# Patient Record
Sex: Female | Born: 1990
Health system: Southern US, Community
[De-identification: ages and names within clinical notes are randomized; demographics above are authoritative.]

## PROBLEM LIST (undated history)

## (undated) DIAGNOSIS — D259 Leiomyoma of uterus, unspecified: Secondary | ICD-10-CM

## (undated) DIAGNOSIS — O24419 Gestational diabetes mellitus in pregnancy, unspecified control: Secondary | ICD-10-CM

## (undated) HISTORY — DX: Leiomyoma of uterus, unspecified: D25.9

## (undated) HISTORY — PX: NO PAST SURGERIES: SHX2092

---

## 2013-01-08 DIAGNOSIS — F411 Generalized anxiety disorder: Secondary | ICD-10-CM | POA: Insufficient documentation

## 2013-02-21 DIAGNOSIS — K219 Gastro-esophageal reflux disease without esophagitis: Secondary | ICD-10-CM | POA: Insufficient documentation

## 2015-10-08 DIAGNOSIS — K649 Unspecified hemorrhoids: Secondary | ICD-10-CM | POA: Insufficient documentation

## 2019-09-20 ENCOUNTER — Encounter: Payer: Self-pay | Admitting: Advanced Practice Midwife

## 2019-09-29 ENCOUNTER — Other Ambulatory Visit: Payer: Self-pay

## 2019-09-29 ENCOUNTER — Ambulatory Visit (INDEPENDENT_AMBULATORY_CARE_PROVIDER_SITE_OTHER): Payer: 59 | Admitting: Family Medicine

## 2019-09-29 ENCOUNTER — Encounter: Payer: Self-pay | Admitting: Family Medicine

## 2019-09-29 DIAGNOSIS — Z3400 Encounter for supervision of normal first pregnancy, unspecified trimester: Secondary | ICD-10-CM

## 2019-09-29 DIAGNOSIS — Z3401 Encounter for supervision of normal first pregnancy, first trimester: Secondary | ICD-10-CM

## 2019-09-29 DIAGNOSIS — Z3A09 9 weeks gestation of pregnancy: Secondary | ICD-10-CM

## 2019-09-29 DIAGNOSIS — Z113 Encounter for screening for infections with a predominantly sexual mode of transmission: Secondary | ICD-10-CM | POA: Diagnosis not present

## 2019-09-29 DIAGNOSIS — O0990 Supervision of high risk pregnancy, unspecified, unspecified trimester: Secondary | ICD-10-CM | POA: Insufficient documentation

## 2019-09-29 NOTE — Progress Notes (Signed)
  Subjective:  Crystal Joseph is a G1P0 [redacted]w[redacted]d by LMP being seen today for her first obstetrical visit.  Her obstetrical history is significant for first pregnancy. FOB is her partner. Patient does intend to breast feed. Pregnancy history fully reviewed.  She reports a normal PAP for the past two years, done with [facility].  Patient reports no complaints.  BP 106/60   Pulse 80   Ht 4\' 11"  (1.499 m)   Wt 118 lb (53.5 kg)   LMP 07/26/2019 (Exact Date)   BMI 23.83 kg/m   HISTORY: OB History  Gravida Para Term Preterm AB Living  1            SAB TAB Ectopic Multiple Live Births               # Outcome Date GA Lbr Len/2nd Weight Sex Delivery Anes PTL Lv  1 Current             History reviewed. No pertinent past medical history.  History reviewed. No pertinent surgical history.  History reviewed. No pertinent family history.   Exam  BP 106/60   Pulse 80   Ht 4\' 11"  (1.499 m)   Wt 118 lb (53.5 kg)   LMP 07/26/2019 (Exact Date)   BMI 23.83 kg/m   Chaperone present during exam  CONSTITUTIONAL: Well-developed, well-nourished female in no acute distress.  HENT:  Normocephalic, atraumatic, External right and left ear normal. Oropharynx is clear and moist EYES: Conjunctivae and EOM are normal. Pupils are equal, round, and reactive to light. No scleral icterus.  NECK: Normal range of motion, supple, no masses.  Normal thyroid.  CARDIOVASCULAR: Normal heart rate noted, regular rhythm RESPIRATORY: Clear to auscultation bilaterally. Effort and breath sounds normal, no problems with respiration noted. BREASTS: Symmetric in size. No masses, skin changes, nipple drainage, or lymphadenopathy. ABDOMEN: Soft, normal bowel sounds, no distention noted.  No tenderness, rebound or guarding.  PELVIC: not done MUSCULOSKELETAL: Normal range of motion. No tenderness.  No cyanosis, clubbing, or edema.  2+ distal pulses. SKIN: Skin is warm and dry. No rash noted. Not diaphoretic. No erythema. No  pallor. NEUROLOGIC: Alert and oriented to person, place, and time. Normal reflexes, muscle tone coordination. No cranial nerve deficit noted. PSYCHIATRIC: Normal mood and affect. Normal behavior. Normal judgment and thought content.    Assessment:    Pregnancy: G1P0 Patient Active Problem List   Diagnosis Date Noted  . Supervision of normal first pregnancy, antepartum 09/29/2019      Plan:   1. Supervision of normal first pregnancy, antepartum FHT and FH normal.  Dating confirmed by [place] today. Discussed diet during prengancy She does want genetic testing - NIPS, CF, SMA ordered Discussed practice with use of midwives, fellows.  - Hemoglobinopathy Evaluation - Obstetric Panel, Including HIV - Culture, OB Urine - GC/Chlamydia probe amp ()not at [facility] - [facility] AMB BABYSCRIPTS SCHEDULE OPTIMIZATION - Hepatitis C Antibody - SMN1 COPY NUMBER ANALYSIS (SMA Carrier Screen) - Cystic fibrosis gene test     Problem list reviewed and updated. 75% of 30 min visit spent on counseling and coordination of care.     Truett Mainland 09/29/2019

## 2019-09-29 NOTE — Progress Notes (Signed)
DATING AND VIABILITY SONOGRAM   Crystal Joseph is a 29 y.o. year old G1P0 with LMP Patient's last menstrual period was 07/26/2019 (exact date). which would correlate to  [redacted]w[redacted]d weeks gestation.  She has regular menstrual cycles.   She is here today for a confirmatory initial sonogram.    GESTATION: SINGLETON     FETAL ACTIVITY:          Heart rate        175 bpm          The fetus is active.       GESTATIONAL AGE AND  BIOMETRICS:  Gestational criteria: Estimated Date of Delivery: 05/01/20 by LMP now at [redacted]w[redacted]d  Previous Scans:0  GESTATIONAL SAC           4.09cm         9-3weeks  CROWN RUMP LENGTH           2.85 cm         9-4 weeks                                                                               AVERAGE EGA(BY THIS SCAN):  9-4 weeks  WORKING EDD( LMP ):  05-01-2020     TECHNICIAN COMMENTS: Patient informed that the ultrasound is considered a limited obstetric ultrasound and is not intended to be a complete ultrasound exam. Patient also informed that the ultrasound is not being completed with the intent of assessing for fetal or placental anomalies or any pelvic abnormalities. Explained that the purpose of today's ultrasound is to assess for fetal heart rate. Patient acknowledges the purpose of the exam and the limitations of the study.     Kathrene Alu 09/29/2019 9:53 AM

## 2019-09-29 NOTE — Patient Instructions (Signed)
First Trimester of Pregnancy The first trimester of pregnancy is from week 1 until the end of week 13 (months 1 through 3). A week after a sperm fertilizes an egg, the egg will implant on the wall of the uterus. This embryo will begin to develop into a baby. Genes from you and your partner will form the baby. The female genes will determine whether the baby will be a boy or a girl. At 6-8 weeks, the eyes and face will be formed, and the heartbeat can be seen on ultrasound. At the end of 12 weeks, all the baby's organs will be formed. Now that you are pregnant, you will want to do everything you can to have a healthy baby. Two of the most important things are to get good prenatal care and to follow your health care provider's instructions. Prenatal care is all the medical care you receive before the baby's birth. This care will help prevent, find, and treat any problems during the pregnancy and childbirth. Body changes during your first trimester Your body goes through many changes during pregnancy. The changes vary from woman to woman.  You may gain or lose a couple of pounds at first.  You may feel sick to your stomach (nauseous) and you may throw up (vomit). If the vomiting is uncontrollable, call your health care provider.  You may tire easily.  You may develop headaches that can be relieved by medicines. All medicines should be approved by your health care provider.  You may urinate more often. Painful urination may mean you have a bladder infection.  You may develop heartburn as a result of your pregnancy.  You may develop constipation because certain hormones are causing the muscles that push stool through your intestines to slow down.  You may develop hemorrhoids or swollen veins (varicose veins).  Your breasts may begin to grow larger and become tender. Your nipples may stick out more, and the tissue that surrounds them (areola) may become darker.  Your gums may bleed and may be  sensitive to brushing and flossing.  Dark spots or blotches (chloasma, mask of pregnancy) may develop on your face. This will likely fade after the baby is born.  Your menstrual periods will stop.  You may have a loss of appetite.  You may develop cravings for certain kinds of food.  You may have changes in your emotions from day to day, such as being excited to be pregnant or being concerned that something may go wrong with the pregnancy and baby.  You may have more vivid and strange dreams.  You may have changes in your hair. These can include thickening of your hair, rapid growth, and changes in texture. Some women also have hair loss during or after pregnancy, or hair that feels dry or thin. Your hair will most likely return to normal after your baby is born. What to expect at prenatal visits During a routine prenatal visit:  You will be weighed to make sure you and the baby are growing normally.  Your blood pressure will be taken.  Your abdomen will be measured to track your baby's growth.  The fetal heartbeat will be listened to between weeks 10 and 14 of your pregnancy.  Test results from any previous visits will be discussed. Your health care provider may ask you:  How you are feeling.  If you are feeling the baby move.  If you have had any abnormal symptoms, such as leaking fluid, bleeding, severe headaches, or abdominal   cramping.  If you are using any tobacco products, including cigarettes, chewing tobacco, and electronic cigarettes.  If you have any questions. Other tests that may be performed during your first trimester include:  Blood tests to find your blood type and to check for the presence of any previous infections. The tests will also be used to check for low iron levels (anemia) and protein on red blood cells (Rh antibodies). Depending on your risk factors, or if you previously had diabetes during pregnancy, you may have tests to check for high blood sugar  that affects pregnant women (gestational diabetes).  Urine tests to check for infections, diabetes, or protein in the urine.  An ultrasound to confirm the proper growth and development of the baby.  Fetal screens for spinal cord problems (spina bifida) and Down syndrome.  HIV (human immunodeficiency virus) testing. Routine prenatal testing includes screening for HIV, unless you choose not to have this test.  You may need other tests to make sure you and the baby are doing well. Follow these instructions at home: Medicines  Follow your health care provider's instructions regarding medicine use. Specific medicines may be either safe or unsafe to take during pregnancy.  Take a prenatal vitamin that contains at least 600 micrograms (mcg) of folic acid.  If you develop constipation, try taking a stool softener if your health care provider approves. Eating and drinking   Eat a balanced diet that includes fresh fruits and vegetables, whole grains, good sources of protein such as meat, eggs, or tofu, and low-fat dairy. Your health care provider will help you determine the amount of weight gain that is right for you.  Avoid raw meat and uncooked cheese. These carry germs that can cause birth defects in the baby.  Eating four or five small meals rather than three large meals a day may help relieve nausea and vomiting. If you start to feel nauseous, eating a few soda crackers can be helpful. Drinking liquids between meals, instead of during meals, also seems to help ease nausea and vomiting.  Limit foods that are high in fat and processed sugars, such as fried and sweet foods.  To prevent constipation: ? Eat foods that are high in fiber, such as fresh fruits and vegetables, whole grains, and beans. ? Drink enough fluid to keep your urine clear or pale yellow. Activity  Exercise only as directed by your health care provider. Most women can continue their usual exercise routine during  pregnancy. Try to exercise for 30 minutes at least 5 days a week. Exercising will help you: ? Control your weight. ? Stay in shape. ? Be prepared for labor and delivery.  Experiencing pain or cramping in the lower abdomen or lower back is a good sign that you should stop exercising. Check with your health care provider before continuing with normal exercises.  Try to avoid standing for long periods of time. Move your legs often if you must stand in one place for a long time.  Avoid heavy lifting.  Wear low-heeled shoes and practice good posture.  You may continue to have sex unless your health care provider tells you not to. Relieving pain and discomfort  Wear a good support bra to relieve breast tenderness.  Take warm sitz baths to soothe any pain or discomfort caused by hemorrhoids. Use hemorrhoid cream if your health care provider approves.  Rest with your legs elevated if you have leg cramps or low back pain.  If you develop varicose veins in   your legs, wear support hose. Elevate your feet for 15 minutes, 3-4 times a day. Limit salt in your diet. Prenatal care  Schedule your prenatal visits by the twelfth week of pregnancy. They are usually scheduled monthly at first, then more often in the last 2 months before delivery.  Write down your questions. Take them to your prenatal visits.  Keep all your prenatal visits as told by your health care provider. This is important. Safety  Wear your seat belt at all times when driving.  Make a list of emergency phone numbers, including numbers for family, friends, the hospital, and police and fire departments. General instructions  Ask your health care provider for a referral to a local prenatal education class. Begin classes no later than the beginning of month 6 of your pregnancy.  Ask for help if you have counseling or nutritional needs during pregnancy. Your health care provider can offer advice or refer you to specialists for help  with various needs.  Do not use hot tubs, steam rooms, or saunas.  Do not douche or use tampons or scented sanitary pads.  Do not cross your legs for long periods of time.  Avoid cat litter boxes and soil used by cats. These carry germs that can cause birth defects in the baby and possibly loss of the fetus by miscarriage or stillbirth.  Avoid all smoking, herbs, alcohol, and medicines not prescribed by your health care provider. Chemicals in these products affect the formation and growth of the baby.  Do not use any products that contain nicotine or tobacco, such as cigarettes and e-cigarettes. If you need help quitting, ask your health care provider. You may receive counseling support and other resources to help you quit.  Schedule a dentist appointment. At home, brush your teeth with a soft toothbrush and be gentle when you floss. Contact a health care provider if:  You have dizziness.  You have mild pelvic cramps, pelvic pressure, or nagging pain in the abdominal area.  You have persistent nausea, vomiting, or diarrhea.  You have a bad smelling vaginal discharge.  You have pain when you urinate.  You notice increased swelling in your face, hands, legs, or ankles.  You are exposed to fifth disease or chickenpox.  You are exposed to German measles (rubella) and have never had it. Get help right away if:  You have a fever.  You are leaking fluid from your vagina.  You have spotting or bleeding from your vagina.  You have severe abdominal cramping or pain.  You have rapid weight gain or loss.  You vomit blood or material that looks like coffee grounds.  You develop a severe headache.  You have shortness of breath.  You have any kind of trauma, such as from a fall or a car accident. Summary  The first trimester of pregnancy is from week 1 until the end of week 13 (months 1 through 3).  Your body goes through many changes during pregnancy. The changes vary from  woman to woman.  You will have routine prenatal visits. During those visits, your health care provider will examine you, discuss any test results you may have, and talk with you about how you are feeling. This information is not intended to replace advice given to you by your health care provider. Make sure you discuss any questions you have with your health care provider. Document Revised: 07/03/2017 Document Reviewed: 07/02/2016 Elsevier Patient Education  2020 [employer] Inc[employer]  

## 2019-09-30 LAB — GC/CHLAMYDIA PROBE AMP (~~LOC~~) NOT AT ARMC
Chlamydia: NEGATIVE
Comment: NEGATIVE
Comment: NORMAL
Neisseria Gonorrhea: NEGATIVE

## 2019-10-01 LAB — CULTURE, OB URINE

## 2019-10-01 LAB — URINE CULTURE, OB REFLEX

## 2019-10-12 ENCOUNTER — Other Ambulatory Visit: Payer: Self-pay

## 2019-10-12 DIAGNOSIS — Z3400 Encounter for supervision of normal first pregnancy, unspecified trimester: Secondary | ICD-10-CM

## 2019-10-12 MED ORDER — PRENATAL PLUS 27-1 MG PO TABS: 1.0000 | ORAL_TABLET | Freq: Every day | ORAL | 11 refills | Status: DC

## 2019-10-12 MED FILL — PRENATAL VITAMIN PLUS LOW I: 27-1 | 30 days supply | Qty: 30 | Fill #0

## 2019-10-12 NOTE — Progress Notes (Signed)
Pt sent Mychart message requesting a Rx for prenatal vitamins. Rx was sent to pt's  was sent to pt's pharmacy. chiquita l wilson, CMA

## 2019-10-13 LAB — SMN1 COPY NUMBER ANALYSIS (SMA CARRIER SCREENING)

## 2019-10-13 LAB — HEMOGLOBINOPATHY EVALUATION
Ferritin: 86 ng/mL (ref 15–150)
Hgb A2 Quant: 2.4 % (ref 1.8–3.2)
Hgb A: 96.9 % (ref 96.4–98.8)
Hgb C: 0 %
Hgb F Quant: 0.7 % (ref 0.0–2.0)
Hgb S: 0 %
Hgb Solubility: NEGATIVE
Hgb Variant: 0 %

## 2019-10-13 LAB — OBSTETRIC PANEL, INCLUDING HIV
Antibody Screen: NEGATIVE
Basophils Absolute: 0 10*3/uL (ref 0.0–0.2)
Basos: 0 %
EOS (ABSOLUTE): 0.3 10*3/uL (ref 0.0–0.4)
Eos: 3 %
HIV Screen 4th Generation wRfx: NONREACTIVE
Hematocrit: 39.1 % (ref 34.0–46.6)
Hemoglobin: 12.9 g/dL (ref 11.1–15.9)
Hepatitis B Surface Ag: NEGATIVE
Immature Grans (Abs): 0 10*3/uL (ref 0.0–0.1)
Immature Granulocytes: 0 %
Lymphocytes Absolute: 1.6 10*3/uL (ref 0.7–3.1)
Lymphs: 20 %
MCH: 30.1 pg (ref 26.6–33.0)
MCHC: 33 g/dL (ref 31.5–35.7)
MCV: 91 fL (ref 79–97)
Monocytes Absolute: 0.5 10*3/uL (ref 0.1–0.9)
Monocytes: 6 %
Neutrophils Absolute: 5.5 10*3/uL (ref 1.4–7.0)
Neutrophils: 71 %
Platelets: 268 10*3/uL (ref 150–450)
RBC: 4.29 x10E6/uL (ref 3.77–5.28)
RDW: 13.4 % (ref 11.7–15.4)
RPR Ser Ql: NONREACTIVE
Rh Factor: POSITIVE
Rubella Antibodies, IGG: 4.72 index (ref >0.99)
WBC: 7.9 10*3/uL (ref 3.4–10.8)

## 2019-10-13 LAB — HEPATITIS C ANTIBODY: Hep C Virus Ab: <0.1 s/co ratio (ref 0.0–0.9)

## 2019-10-13 LAB — CYSTIC FIBROSIS GENE TEST

## 2019-10-25 ENCOUNTER — Encounter: Payer: 59 | Admitting: Advanced Practice Midwife

## 2019-10-27 ENCOUNTER — Other Ambulatory Visit: Payer: Self-pay

## 2019-10-27 ENCOUNTER — Ambulatory Visit (INDEPENDENT_AMBULATORY_CARE_PROVIDER_SITE_OTHER): Payer: 59 | Admitting: Family Medicine

## 2019-10-27 VITALS — BP 110/64 | HR 87 | Wt 118.0 lb

## 2019-10-27 DIAGNOSIS — Z3400 Encounter for supervision of normal first pregnancy, unspecified trimester: Secondary | ICD-10-CM

## 2019-10-27 DIAGNOSIS — Z3A13 13 weeks gestation of pregnancy: Secondary | ICD-10-CM

## 2019-10-27 NOTE — Progress Notes (Addendum)
   PRENATAL VISIT NOTE  Subjective:  Crystal Joseph is a 29 y.o. G1P0 at [redacted]w[redacted]d by LMP being seen today for ongoing prenatal care.  She is currently monitored for the following issues for this low-risk pregnancy and has Supervision of normal first pregnancy, antepartum on their problem list.  Patient reports occasional abdominal pain and occasional nausea but no vomiting. Patient reports no bleeding, no contractions, no cramping and no leaking.  Contractions: Not present. Vag. Bleeding: Scant.  Movement: Absent. Denies leaking of fluid.   The following portions of the patient's history were reviewed and updated as appropriate: allergies, current medications, past family history, past medical history, past social history, past surgical history and problem list.   Objective:   Vitals:   10/27/19 0916  BP: 110/64  Pulse: 87  Weight: 118 lb (53.5 kg)    Fetal Status: Fetal Heart Rate (bpm): 160   Movement: Absent     General:  Alert, oriented and cooperative. Patient is in no acute distress.  Skin: Skin is warm and dry. No rash noted.   Cardiovascular: Normal heart rate noted  Respiratory: Normal respiratory effort, no problems with respiration noted  Abdomen: Soft, gravid, appropriate for gestational age.  Pain/Pressure: Present     Pelvic: Cervical exam deferred        Extremities: Normal range of motion.  Edema: None  Mental Status: Normal mood and affect. Normal behavior. Normal judgment and thought content.   Assessment and Plan:  Pregnancy: G1P0 at [redacted]w[redacted]d  1. Supervision of normal first pregnancy, antepartum FH normal. Patient desires NIPS (Panorama), but has to check with insurance to see if it will be covered. If not covered, consider working with company to lower cost or quad screen. F/U in 1 month   Preterm labor symptoms and general obstetric precautions including but not limited to vaginal bleeding, contractions, leaking of fluid and fetal movement were reviewed in detail  with the patient. Please refer to After Visit Summary for other counseling recommendations.   No follow-ups on file.   No future appointments.  Crystal Joseph, Medical Student   I saw the patient in conjunction with Rodrigo Ran, MS3.  No concerns. Patient doing well. Fundal height appropriate for GA. FHT normal. She is checking with insurance about coverage of Panorama. Also discussed Quad screen if it isn't covered. All questions answered.  Truett Mainland, DO 10/27/2019 11:16 AM

## 2019-10-27 NOTE — Progress Notes (Signed)
   PRENATAL VISIT NOTE  Subjective:  Crystal Joseph is a 29 y.o. G1P0 at [redacted]w[redacted]d being seen today for ongoing prenatal care.  She is currently monitored for the following issues for this low-risk pregnancy and has Supervision of normal first pregnancy, antepartum on their problem list.  Patient reports no complaints.  Contractions: Not present. Vag. Bleeding: Scant.  Movement: Absent. Denies leaking of fluid.   The following portions of the patient's history were reviewed and updated as appropriate: allergies, current medications, past family history, past medical history, past social history, past surgical history and problem list.   Objective:   Vitals:   10/27/19 0916  BP: 110/64  Pulse: 87  Weight: 118 lb (53.5 kg)    Fetal Status: Fetal Heart Rate (bpm): 160   Movement: Absent     General:  Alert, oriented and cooperative. Patient is in no acute distress.  Skin: Skin is warm and dry. No rash noted.   Cardiovascular: Normal heart rate noted  Respiratory: Normal respiratory effort, no problems with respiration noted  Abdomen: Soft, gravid, appropriate for gestational age.  Pain/Pressure: Present     Pelvic: Cervical exam deferred        Extremities: Normal range of motion.  Edema: None  Mental Status: Normal mood and affect. Normal behavior. Normal judgment and thought content.   Assessment and Plan:  Pregnancy: G1P0 at [redacted]w[redacted]d 1. Supervision of normal first pregnancy, antepartum FHT and FH normal.   Preterm labor symptoms and general obstetric precautions including but not limited to vaginal bleeding, contractions, leaking of fluid and fetal movement were reviewed in detail with the patient. Please refer to After Visit Summary for other counseling recommendations.   No follow-ups on file.  Future Appointments  Date Time Provider [place]  11/29/2019  8:30 AM Seabron Spates, CNM [facility] None    Truett Mainland, DO

## 2019-11-14 MED FILL — PRENATAL VITAMIN PLUS LOW I: 27-1 | 30 days supply | Qty: 30 | Fill #1

## 2019-11-29 ENCOUNTER — Encounter: Payer: Self-pay | Admitting: Advanced Practice Midwife

## 2019-11-29 ENCOUNTER — Other Ambulatory Visit: Payer: Self-pay

## 2019-11-29 ENCOUNTER — Ambulatory Visit (INDEPENDENT_AMBULATORY_CARE_PROVIDER_SITE_OTHER): Payer: 59 | Admitting: Advanced Practice Midwife

## 2019-11-29 VITALS — BP 98/63 | HR 80 | Wt 124.0 lb

## 2019-11-29 DIAGNOSIS — Z3400 Encounter for supervision of normal first pregnancy, unspecified trimester: Secondary | ICD-10-CM

## 2019-11-29 NOTE — Patient Instructions (Signed)

## 2019-11-29 NOTE — Progress Notes (Signed)
   PRENATAL VISIT NOTE  Subjective:  Crystal Joseph is a 29 y.o. G1P0 at [redacted]w[redacted]d being seen today for ongoing prenatal care.  She is currently monitored for the following issues for this low-risk pregnancy and has Supervision of normal first pregnancy, antepartum; Anxiety state; Esophageal reflux; and Hemorrhoids on their problem list.  Patient reports no complaints.  Contractions: Not present. Vag. Bleeding: None.  Movement: Absent. Denies leaking of fluid.   The following portions of the patient's history were reviewed and updated as appropriate: allergies, current medications, past family history, past medical history, past social history, past surgical history and problem list.   Objective:   Vitals:   11/29/19 0834  BP: 98/63  Pulse: 80  Weight: 124 lb (56.2 kg)    Fetal Status: Fetal Heart Rate (bpm): 151   Movement: Absent     General:  Alert, oriented and cooperative. Patient is in no acute distress.  Skin: Skin is warm and dry. No rash noted.   Cardiovascular: Normal heart rate noted  Respiratory: Normal respiratory effort, no problems with respiration noted  Abdomen: Soft, gravid, appropriate for gestational age.  Pain/Pressure: Present     Pelvic: Cervical exam deferred        Extremities: Normal range of motion.  Edema: None  Mental Status: Normal mood and affect. Normal behavior. Normal judgment and thought content.   Assessment and Plan:  Pregnancy: G1P0 at [redacted]w[redacted]d 1. Supervision of normal first pregnancy, antepartum       Not feeling movement yet       Plan [place] for anatomy       Declines panorama but wants to do AFP today - [place] MFM OB COMP + 14 WK; Future - AFP, Serum, Open Spina Bifida  Preterm labor symptoms and general obstetric precautions including but not limited to vaginal bleeding, contractions, leaking of fluid and fetal movement were reviewed in detail with the patient. Please refer to After Visit Summary for other counseling recommendations.   Return in about  6 weeks (around 01/10/2020) for [facility].  Future Appointments  Date Time Provider [place]  12/16/2019  2:45 PM [place] 4 [facility]  01/10/2020  8:30 AM Seabron Spates, CNM [facility] None    Hansel Feinstein, CNM

## 2019-12-01 LAB — AFP, SERUM, OPEN SPINA BIFIDA
AFP MoM: 1.1
AFP Value: 56 ng/mL
Gest. Age on Collection Date: 18 weeks
Maternal Age At EDD: 31.6 yr
OSBR Risk 1 IN: 8933
Test Results:: NEGATIVE
Weight: 124 [lb_av]

## 2019-12-16 ENCOUNTER — Other Ambulatory Visit: Payer: Self-pay

## 2019-12-16 ENCOUNTER — Ambulatory Visit (HOSPITAL_COMMUNITY): Payer: 59 | Attending: Obstetrics and Gynecology

## 2019-12-16 DIAGNOSIS — Z363 Encounter for antenatal screening for malformations: Secondary | ICD-10-CM

## 2019-12-16 DIAGNOSIS — Z3400 Encounter for supervision of normal first pregnancy, unspecified trimester: Secondary | ICD-10-CM | POA: Insufficient documentation

## 2019-12-16 DIAGNOSIS — D259 Leiomyoma of uterus, unspecified: Secondary | ICD-10-CM

## 2019-12-16 DIAGNOSIS — Z3A20 20 weeks gestation of pregnancy: Secondary | ICD-10-CM | POA: Diagnosis not present

## 2019-12-16 DIAGNOSIS — O3412 Maternal care for benign tumor of corpus uteri, second trimester: Secondary | ICD-10-CM | POA: Diagnosis not present

## 2019-12-20 ENCOUNTER — Other Ambulatory Visit: Payer: Self-pay | Admitting: *Deleted

## 2019-12-20 DIAGNOSIS — D259 Leiomyoma of uterus, unspecified: Secondary | ICD-10-CM

## 2020-01-10 ENCOUNTER — Encounter: Payer: Self-pay | Admitting: Advanced Practice Midwife

## 2020-01-10 ENCOUNTER — Ambulatory Visit (INDEPENDENT_AMBULATORY_CARE_PROVIDER_SITE_OTHER): Payer: 59 | Admitting: Advanced Practice Midwife

## 2020-01-10 ENCOUNTER — Other Ambulatory Visit: Payer: Self-pay

## 2020-01-10 DIAGNOSIS — Z3402 Encounter for supervision of normal first pregnancy, second trimester: Secondary | ICD-10-CM

## 2020-01-10 DIAGNOSIS — Z3400 Encounter for supervision of normal first pregnancy, unspecified trimester: Secondary | ICD-10-CM

## 2020-01-10 DIAGNOSIS — Z3A24 24 weeks gestation of pregnancy: Secondary | ICD-10-CM

## 2020-01-10 NOTE — Progress Notes (Signed)
PRENATAL VISIT NOTE  Subjective:  Crystal Joseph is a 29 y.o. G1P0 at [redacted]w[redacted]d being seen today for ongoing prenatal care.  She is currently monitored for the following issues for this low-risk pregnancy and has Supervision of normal first pregnancy, antepartum; Anxiety state; Esophageal reflux; and Hemorrhoids on their problem list.  Patient reports occasional round ligament pain.  Contractions: Not present. Vag. Bleeding: None.  Movement: Present. Denies leaking of fluid.   The following portions of the patient's history were reviewed and updated as appropriate: allergies, current medications, past family history, past medical history, past social history, past surgical history and problem list.   Objective:   Vitals:   01/10/20 0838  BP: 94/64  Pulse: 82  Weight: 130 lb (59 kg)    Fetal Status: Fetal Heart Rate (bpm): 162   Movement: Present     General:  Alert, oriented and cooperative. Patient is in no acute distress.  Skin: Skin is warm and dry. No rash noted.   Cardiovascular: Normal heart rate noted  Respiratory: Normal respiratory effort, no problems with respiration noted  Abdomen: Soft, gravid, appropriate for gestational age.  Pain/Pressure: Present     Pelvic: Cervical exam deferred        Extremities: Normal range of motion.  Edema: None  Mental Status: Normal mood and affect. Normal behavior. Normal judgment and thought content.   Assessment and Plan:  Pregnancy: G1P0 at [redacted]w[redacted]d 1. Supervision of normal first pregnancy, antepartum     Discussed round ligament pain     May want waterbirth    Why consider waterbirth? . Gentle birth for babies . Less pain medicine used in labor . May allow for passive descent/less pushing . May reduce perineal tears  . More mobility and instinctive maternal position changes . Increased maternal relaxation . Reduced blood pressure in labor  Is waterbirth safe? What are the risks of infection, drowning or other  complications?  . Infection: o Very low risk (3.7 % for tub vs 4.8% for bed) o 7 in 8000 waterbirths with documented infection o Poorly cleaned equipment most common cause o Slightly lower group B strep transmission rate  . Drowning o Maternal:  - Very low risk   - Related to seizures or fainting o Newborn:  - Very low risk. No evidence of increased risk of respiratory problems in multiple large studies - Physiological protection from breathing under water - Avoid underwater birth if there are any fetal complications - Once baby's head is out of the water, keep it out.  . Birth complication o Some reports of cord trauma, but risk decreased by bringing baby to surface gradually o No evidence of increased risk of shoulder dystocia. Mothers can usually change positions faster in water than in a bed, possibly aiding the maneuvers to free the shoulder.   Am I a candidate for waterbirth?  Yes, if you are: . Full-term (37 weeks or greater)  . Have had an uncomplicated pregnancy and labor  No, if you have: Marland Kitchen Preterm birth less than 37 weeks . Thick, particulate meconium stained fluid . Maternal fever over 101 . Heavy bleeding or signs of placental abruption . Pre-eclampsia  . Any abnormal fetal heart rate pattern . Breech presentation . Twins  . Very large baby . Active communicable infection (this does NOT include group B strep) . Significant limitation to mobility  Please remember that birth is unpredictable. Under certain unforeseeable circumstances your provider may advise against giving birth in the tub. These  decisions will be made on a case-by-case basis and with the safety of you and your baby as our highest priority.  Requirements for patients planning waterbirth  . Ask your midwife if you will be a candidate for waterbirth. . Attend the [facility] [employer] at [phone] or [phone] for dates and times. The class is  free and we strongly encourage you to bring your support person. You will receive a certificate of participation to show to your midwife or doctor. . Supplies o Waterbirth tub (NOT kiddie pool) o Architectural technologist pump to inflate tub (manual-foot pump or electric) o New garden hose labeled "lead-free", "suitable for drinking water", "non-toxic" OR "water potable" o Faucet adaptor to attach hose to faucet         o Electric drain pump to remove water (We recommend 792 gallon per hour or greater pump.)  o Water thermometer (baby store / pool supplies) o Occupational psychologist top (optional) o Long-handled mirror (optional)   The above information was reviewed with the patient and she verbally consented and acknowledged the eligibility criteria as well as contraindications and procedures for both labor and birth. The patient also acknowledged that she has been informed that during the course of labor and birth unforeseen conditions may occur which may require her to leave the water. The patients agrees to follow the instructions from the nurse, nurse midwife and/or physician including getting out of the tub if deemed medically necessary.  Plan [place] next visit  Preterm labor symptoms and general obstetric precautions including but not limited to vaginal bleeding, contractions, leaking of fluid and fetal movement were reviewed in detail with the patient. Please refer to After Visit Summary for other counseling recommendations.   Return in about 4 weeks (around 02/07/2020) for [facility].  Future Appointments  Date Time Provider [place]  02/10/2020 12:45 PM [facility] NURSE [facility]  02/10/2020 12:45 PM [facility] US5 [facility] [place]    Hansel Feinstein, CNM

## 2020-01-10 NOTE — Patient Instructions (Signed)

## 2020-02-10 ENCOUNTER — Other Ambulatory Visit: Payer: Self-pay

## 2020-02-10 ENCOUNTER — Ambulatory Visit: Payer: 59 | Attending: Obstetrics

## 2020-02-10 ENCOUNTER — Ambulatory Visit: Payer: 59 | Admitting: *Deleted

## 2020-02-10 ENCOUNTER — Other Ambulatory Visit: Payer: Self-pay | Admitting: *Deleted

## 2020-02-10 ENCOUNTER — Ambulatory Visit (INDEPENDENT_AMBULATORY_CARE_PROVIDER_SITE_OTHER): Payer: 59 | Admitting: Family Medicine

## 2020-02-10 VITALS — BP 102/58 | HR 90

## 2020-02-10 DIAGNOSIS — O3413 Maternal care for benign tumor of corpus uteri, third trimester: Secondary | ICD-10-CM | POA: Diagnosis not present

## 2020-02-10 DIAGNOSIS — Z3A28 28 weeks gestation of pregnancy: Secondary | ICD-10-CM | POA: Diagnosis not present

## 2020-02-10 DIAGNOSIS — D259 Leiomyoma of uterus, unspecified: Secondary | ICD-10-CM

## 2020-02-10 DIAGNOSIS — Z362 Encounter for other antenatal screening follow-up: Secondary | ICD-10-CM | POA: Diagnosis not present

## 2020-02-10 DIAGNOSIS — Z23 Encounter for immunization: Secondary | ICD-10-CM | POA: Diagnosis not present

## 2020-02-10 DIAGNOSIS — Z3400 Encounter for supervision of normal first pregnancy, unspecified trimester: Secondary | ICD-10-CM | POA: Insufficient documentation

## 2020-02-10 DIAGNOSIS — Z3403 Encounter for supervision of normal first pregnancy, third trimester: Secondary | ICD-10-CM

## 2020-02-10 DIAGNOSIS — O3412 Maternal care for benign tumor of corpus uteri, second trimester: Secondary | ICD-10-CM

## 2020-02-10 NOTE — Progress Notes (Signed)
   PRENATAL VISIT NOTE  Subjective:  Crystal Joseph is a 29 y.o. G1P0 at [redacted]w[redacted]d being seen today for ongoing prenatal care.  She is currently monitored for the following issues for this low-risk pregnancy and has Supervision of normal first pregnancy, antepartum; Anxiety state; Esophageal reflux; and Hemorrhoids on their problem list.  Patient reports no complaints.  Contractions: Not present. Vag. Bleeding: None.  Movement: Present. Denies leaking of fluid.   The following portions of the patient's history were reviewed and updated as appropriate: allergies, current medications, past family history, past medical history, past social history, past surgical history and problem list.   Objective:   Vitals:   02/10/20 0837  BP: 94/63  Pulse: 82  Weight: 131 lb (59.4 kg)    Fetal Status: Fetal Heart Rate (bpm): 157 Fundal Height: 28 cm Movement: Present     General:  Alert, oriented and cooperative. Patient is in no acute distress.  Skin: Skin is warm and dry. No rash noted.   Cardiovascular: Normal heart rate noted  Respiratory: Normal respiratory effort, no problems with respiration noted  Abdomen: Soft, gravid, appropriate for gestational age.  Pain/Pressure: Present     Pelvic: Cervical exam deferred        Extremities: Normal range of motion.  Edema: None  Mental Status: Normal mood and affect. Normal behavior. Normal judgment and thought content.   Assessment and Plan:  Pregnancy: G1P0 at [redacted]w[redacted]d 1. Supervision of normal first pregnancy, antepartum FHT and FH normal - CBC - Glucose Tolerance, 2 Hours w/1 Hour - HIV Antibody (routine testing w rflx) - RPR - Tdap vaccine greater than or equal to 7yo IM  Preterm labor symptoms and general obstetric precautions including but not limited to vaginal bleeding, contractions, leaking of fluid and fetal movement were reviewed in detail with the patient. Please refer to After Visit Summary for other counseling recommendations.   No  follow-ups on file.  Future Appointments  Date Time Provider [place]  02/10/2020 12:45 PM [facility] NURSE [facility]  02/10/2020 12:45 PM [facility] US5 [facility] Joanna, DO

## 2020-02-11 DIAGNOSIS — O24419 Gestational diabetes mellitus in pregnancy, unspecified control: Secondary | ICD-10-CM

## 2020-02-11 LAB — CBC
Hematocrit: 34.6 % (ref 34.0–46.6)
Hemoglobin: 11.5 g/dL (ref 11.1–15.9)
MCH: 30.7 pg (ref 26.6–33.0)
MCHC: 33.2 g/dL (ref 31.5–35.7)
MCV: 92 fL (ref 79–97)
Platelets: 259 10*3/uL (ref 150–450)
RBC: 3.75 x10E6/uL — ABNORMAL LOW (ref 3.77–5.28)
RDW: 13.7 % (ref 11.7–15.4)
WBC: 11 10*3/uL — ABNORMAL HIGH (ref 3.4–10.8)

## 2020-02-11 LAB — GLUCOSE TOLERANCE, 2 HOURS W/ 1HR
Glucose, 1 hour: 209 mg/dL — ABNORMAL HIGH (ref 65–179)
Glucose, 2 hour: 233 mg/dL — ABNORMAL HIGH (ref 65–152)
Glucose, Fasting: 99 mg/dL — ABNORMAL HIGH (ref 65–91)

## 2020-02-11 LAB — RPR: RPR Ser Ql: NONREACTIVE

## 2020-02-11 LAB — HIV ANTIBODY (ROUTINE TESTING W REFLEX): HIV Screen 4th Generation wRfx: NONREACTIVE

## 2020-02-13 ENCOUNTER — Other Ambulatory Visit: Payer: Self-pay

## 2020-02-13 DIAGNOSIS — O24419 Gestational diabetes mellitus in pregnancy, unspecified control: Secondary | ICD-10-CM

## 2020-02-13 MED ORDER — ACCU-CHEK SMARTVIEW VI STRP: ORAL_STRIP | 12 refills | Status: DC

## 2020-02-13 MED ORDER — ACCU-CHEK SOFTCLIX LANCETS MISC
1.0000 | Freq: Four times a day (QID) | 12 refills | Status: DC
Start: 2020-02-13 — End: 2020-04-21

## 2020-02-13 MED ORDER — ACCU-CHEK NANO SMARTVIEW W/DEVICE KIT: 1.0000 | PACK | 0 refills | Status: DC

## 2020-02-13 MED FILL — FREESTYLE LITE TEST STRIP: 25 days supply | Qty: 100 | Fill #0

## 2020-02-13 MED FILL — FREESTYLE LITE METER: 30 days supply | Qty: 1 | Fill #0

## 2020-02-13 MED FILL — FREESTYLE LANCETS: 25 days supply | Qty: 100 | Fill #0

## 2020-02-15 ENCOUNTER — Encounter: Payer: 59 | Attending: Family Medicine | Admitting: Registered"

## 2020-02-15 ENCOUNTER — Other Ambulatory Visit: Payer: Self-pay

## 2020-02-15 DIAGNOSIS — O99810 Abnormal glucose complicating pregnancy: Secondary | ICD-10-CM | POA: Insufficient documentation

## 2020-02-17 NOTE — Progress Notes (Signed)
Patient was seen on 02/15/20 for Gestational Diabetes self-management class at [facility]. The following learning objectives were met by the patient during this course:   States the definition of Gestational Diabetes  States why dietary management is important in controlling blood glucose  Describes the effects each nutrient has on blood glucose levels  Demonstrates ability to create a balanced meal plan  Demonstrates carbohydrate counting   States when to check blood glucose levels  Demonstrates proper blood glucose monitoring techniques  States the effect of stress and exercise on blood glucose levels  States the importance of limiting caffeine and abstaining from alcohol and smoking  Blood glucose monitor given: none, pt has meter and is checking CBG  Patient instructed to monitor glucose levels: FBS: 60 - <95; 1 hour: <140; 2 hour: <120  Patient received handouts:  Nutrition Diabetes and Pregnancy, including carb counting list  Patient will be seen for follow-up as needed.

## 2020-02-20 ENCOUNTER — Encounter: Payer: Self-pay | Admitting: Registered"

## 2020-02-20 DIAGNOSIS — O24419 Gestational diabetes mellitus in pregnancy, unspecified control: Secondary | ICD-10-CM | POA: Insufficient documentation

## 2020-03-01 ENCOUNTER — Ambulatory Visit (INDEPENDENT_AMBULATORY_CARE_PROVIDER_SITE_OTHER): Payer: 59 | Admitting: Obstetrics and Gynecology

## 2020-03-01 ENCOUNTER — Other Ambulatory Visit: Payer: Self-pay

## 2020-03-01 VITALS — BP 104/64 | HR 76 | Wt 130.0 lb

## 2020-03-01 DIAGNOSIS — O3410 Maternal care for benign tumor of corpus uteri, unspecified trimester: Secondary | ICD-10-CM

## 2020-03-01 DIAGNOSIS — O0993 Supervision of high risk pregnancy, unspecified, third trimester: Secondary | ICD-10-CM

## 2020-03-01 DIAGNOSIS — Z3A31 31 weeks gestation of pregnancy: Secondary | ICD-10-CM

## 2020-03-01 DIAGNOSIS — O3413 Maternal care for benign tumor of corpus uteri, third trimester: Secondary | ICD-10-CM

## 2020-03-01 DIAGNOSIS — D259 Leiomyoma of uterus, unspecified: Secondary | ICD-10-CM

## 2020-03-01 DIAGNOSIS — O24419 Gestational diabetes mellitus in pregnancy, unspecified control: Secondary | ICD-10-CM

## 2020-03-01 DIAGNOSIS — O24410 Gestational diabetes mellitus in pregnancy, diet controlled: Secondary | ICD-10-CM

## 2020-03-01 NOTE — Progress Notes (Signed)
Prenatal Visit Note Date: 03/01/2020 Clinic: [facility]  Subjective:  Crystal Joseph is a 29 y.o. G1P0 at [redacted]w[redacted]d being seen today for ongoing prenatal care.  She is currently monitored for the following issues for this high-risk pregnancy and has Supervision of normal first pregnancy, antepartum; Anxiety state; Esophageal reflux; Hemorrhoids; GDM (gestational diabetes mellitus); and Uterine fibroid in pregnancy on their problem list.  Patient reports no complaints.   Contractions: Not present. Vag. Bleeding: None.  Movement: Present. Denies leaking of fluid.   The following portions of the patient's history were reviewed and updated as appropriate: allergies, current medications, past family history, past medical history, past social history, past surgical history and problem list. Problem list updated.  Objective:   Vitals:   03/01/20 1105  BP: (!) 104/64  Pulse: 76  Weight: 130 lb (59 kg)    Fetal Status: Fetal Heart Rate (bpm): 145 Fundal Height: 31 cm Movement: Present     General:  Alert, oriented and cooperative. Patient is in no acute distress.  Skin: Skin is warm and dry. No rash noted.   Cardiovascular: Normal heart rate noted  Respiratory: Normal respiratory effort, no problems with respiration noted  Abdomen: Soft, gravid, appropriate for gestational age. Pain/Pressure: Absent     Pelvic:  Cervical exam deferred        Extremities: Normal range of motion.  Edema: None  Mental Status: Normal mood and affect. Normal behavior. Normal judgment and thought content.   Urinalysis:      Assessment and Plan:  Pregnancy: G1P0 at [redacted]w[redacted]d  1. Supervision of high risk pregnancy in third trimester Routine care  2. Uterine fibroid in pregnancy Left LUS IM 4cm fibroid. Follow up with subsequent growth u/s  3. Gestational diabetes mellitus (GDM) in third trimester, gestational diabetes method of control unspecified Normal CBG log. Has growth for  early September already. D/w her re: plan of care with GDMa1 control.  Preterm labor symptoms and general obstetric precautions including but not limited to vaginal bleeding, contractions, leaking of fluid and fetal movement were reviewed in detail with the patient. Please refer to After Visit Summary for other counseling recommendations.  Return in about 2 weeks (around 03/15/2020) for high risk, in person.   Aletha Halim, MD

## 2020-03-04 ENCOUNTER — Encounter (HOSPITAL_COMMUNITY): Payer: Self-pay | Admitting: Obstetrics & Gynecology

## 2020-03-04 ENCOUNTER — Other Ambulatory Visit: Payer: Self-pay

## 2020-03-04 ENCOUNTER — Inpatient Hospital Stay (HOSPITAL_COMMUNITY)
Admission: AD | Admit: 2020-03-04 | Discharge: 2020-03-04 | Disposition: A | Discharge status: Home / Self Care | Payer: 59 | Attending: Obstetrics & Gynecology | Admitting: Obstetrics & Gynecology

## 2020-03-04 DIAGNOSIS — O26893 Other specified pregnancy related conditions, third trimester: Secondary | ICD-10-CM | POA: Diagnosis not present

## 2020-03-04 DIAGNOSIS — O3413 Maternal care for benign tumor of corpus uteri, third trimester: Secondary | ICD-10-CM | POA: Insufficient documentation

## 2020-03-04 DIAGNOSIS — Z3A31 31 weeks gestation of pregnancy: Secondary | ICD-10-CM | POA: Insufficient documentation

## 2020-03-04 DIAGNOSIS — R102 Pelvic and perineal pain: Secondary | ICD-10-CM | POA: Insufficient documentation

## 2020-03-04 DIAGNOSIS — Z79899 Other long term (current) drug therapy: Secondary | ICD-10-CM | POA: Insufficient documentation

## 2020-03-04 DIAGNOSIS — D251 Intramural leiomyoma of uterus: Secondary | ICD-10-CM | POA: Insufficient documentation

## 2020-03-04 DIAGNOSIS — O24429 Gestational diabetes mellitus in childbirth, unspecified control: Secondary | ICD-10-CM

## 2020-03-04 DIAGNOSIS — D259 Leiomyoma of uterus, unspecified: Secondary | ICD-10-CM

## 2020-03-04 DIAGNOSIS — N949 Unspecified condition associated with female genital organs and menstrual cycle: Secondary | ICD-10-CM

## 2020-03-04 LAB — URINALYSIS, ROUTINE W REFLEX MICROSCOPIC
Bilirubin Urine: NEGATIVE
Glucose, UA: NEGATIVE mg/dL
Hgb urine dipstick: NEGATIVE
Ketones, ur: NEGATIVE mg/dL
Leukocytes,Ua: NEGATIVE
Nitrite: NEGATIVE
Protein, ur: NEGATIVE mg/dL
Specific Gravity, Urine: 1.015 (ref 1.005–1.030)
pH: 7 (ref 5.0–8.0)

## 2020-03-04 NOTE — MAU Provider Note (Signed)
History     CSN: [id number]  Arrival date and time: 03/04/20 1137   None     Chief Complaint  Patient presents with  . Pelvic Pain  . Abdominal Pain   HPI   G1P0 at 49w5dwho presents for abdominal pressure. Pregnancy complicated by [id number] uterine fibroid.  Started noticing pressure yesterday. Feels pressure in vagina and left side of pelvis. Feels it most with walking. Gets better with sitting. Feels some cramping but no contractions. Mainly just pressure. 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 Reports that she does some heavier lifting including lifting patients. She has been using a maternity support belt which she says helps the discomfort. No vaginal bleeding or abnormal discharge. No pain with urination. No back pain. No recent fall or trauma.   Gyn hx notable for lower uterine segment intramural myoma, measured 4.6x3.7x4.6cm.   Past Medical History:  Diagnosis Date  . Uterine fibroid     Past Surgical History:  Procedure Laterality Date  . NO PAST SURGERIES      History reviewed. No pertinent family history.  Social History   Tobacco Use  . Smoking status: Never Smoker  . Smokeless tobacco: Never Used  Substance Use Topics  . Alcohol use: Never  . Drug use: Never    Allergies: No Known Allergies  Medications Prior to Admission  Medication Sig Dispense Refill Last Dose  . Accu-Chek Softclix Lancets lancets 1 each by Other route 4 (four) times daily. 100 each 12   . Blood Glucose Monitoring Suppl (ACCU-CHEK NANO SMARTVIEW) w/Device KIT 1 kit by Subdermal route as directed. Check blood sugars for fasting, and two hours after breakfast, lunch and dinner (4 checks daily) 1 kit 0   . glucose blood (ACCU-CHEK SMARTVIEW) test strip Use as instructed to check blood sugars 100 each 12   . prenatal vitamin w/FE, FA (PRENATAL 1 + 1) 27-1 MG TABS tablet Take 1 tablet by mouth daily at 12 noon. 30 tablet 11      Review of Systems  Gastrointestinal: Negative for constipation and diarrhea.  Genitourinary: Negative for difficulty urinating, dysuria, flank pain and vaginal bleeding.  Neurological: Negative for dizziness.   Physical Exam   Blood pressure 119/71, pulse 81, temperature 98.3 F (36.8 C), temperature source Oral, resp. rate 16, last menstrual period 07/26/2019, SpO2 98 %.  Physical Exam Vitals and nursing note reviewed. Exam conducted with a chaperone present.  Constitutional:      Appearance: She is well-developed.  HENT:     Head: Normocephalic and atraumatic.  Abdominal:     General: Bowel sounds are normal.     Palpations: Abdomen is soft.     Comments: Mildly TTP of LLQ. No rebound or guarding. No CVA tenderness.   Genitourinary:    Comments: Cervix is soft, internally closed. Long and high.  Skin:    General: Skin is warm and dry.  Neurological:     Mental Status: She is alert.     MAU Course  Procedures  MDM -patient evaluated at bedside -NST reactive -cervix is soft but long/closed  -discussed round ligament pain mgmt, continued use of maternity support belt   Assessment and Plan   Pelvic Pain, likely round ligament pain  -encouraged use of maternity support belt, tylenol prn -encouraged stretching and pelvic strengthening exercises, rest as needed  -provided w return precautions, stable for discharge home    JJanet Berlin8/08/2019,  12:23 PM  OB Family Medicine Fellow, [facility] for [employer], [place]

## 2020-03-04 NOTE — MAU Note (Signed)
Pt reports to mau with c/o lower abd/pelvic pain that started last night.  Pt states the pain radiates to her sides when she moves.  Pt states she feels more pressure and pulling when she changes positions.  Pt denies ctx or LOF at this time. Reports good fetal movement. Denies urinary complaints, denies vag dc, bleeding, or itching.

## 2020-03-05 MED FILL — FREESTYLE LANCETS: 25 days supply | Qty: 100 | Fill #1

## 2020-03-05 MED FILL — FREESTYLE LITE TEST STRIP: 25 days supply | Qty: 100 | Fill #1

## 2020-03-05 MED FILL — PRENATAL 27-1 MG TABS: 27-1 | 90 days supply | Qty: 90 | Fill #3

## 2020-03-13 ENCOUNTER — Ambulatory Visit (INDEPENDENT_AMBULATORY_CARE_PROVIDER_SITE_OTHER): Payer: 59 | Admitting: Obstetrics and Gynecology

## 2020-03-13 ENCOUNTER — Other Ambulatory Visit: Payer: Self-pay

## 2020-03-13 VITALS — BP 90/60 | HR 84 | Wt 131.0 lb

## 2020-03-13 DIAGNOSIS — Z3A33 33 weeks gestation of pregnancy: Secondary | ICD-10-CM

## 2020-03-13 DIAGNOSIS — O24419 Gestational diabetes mellitus in pregnancy, unspecified control: Secondary | ICD-10-CM

## 2020-03-13 MED ORDER — METFORMIN HCL 500 MG PO TABS: 500.0000 mg | ORAL_TABLET | Freq: Two times a day (BID) | ORAL | 2 refills | Status: DC

## 2020-03-13 MED FILL — METFORMIN HCL 500 MG TABS: 500 | 30 days supply | Qty: 60 | Fill #0

## 2020-03-13 NOTE — Progress Notes (Signed)
   PRENATAL VISIT NOTE  Subjective:  Crystal Joseph is a 29 y.o. G1P0 at [redacted]w[redacted]d being seen today for ongoing prenatal care.  She is currently monitored for the following issues for this high-risk pregnancy and has Supervision of normal first pregnancy, antepartum; Anxiety state; Esophageal reflux; Hemorrhoids; GDM (gestational diabetes mellitus); and Uterine fibroid in pregnancy on their problem list.  Patient reports no complaints.  Contractions: Not present. Vag. Bleeding: None.  Movement: Present. Denies leaking of fluid.   The following portions of the patient's history were reviewed and updated as appropriate: allergies, current medications, past family history, past medical history, past social history, past surgical history and problem list.   Objective:   Vitals:   03/13/20 0843  BP: 90/60  Pulse: 84  Weight: 131 lb (59.4 kg)    Fetal Status: Fetal Heart Rate (bpm): 154 Fundal Height: 33 cm Movement: Present     General:  Alert, oriented and cooperative. Patient is in no acute distress.  Skin: Skin is warm and dry. No rash noted.   Cardiovascular: Normal heart rate noted  Respiratory: Normal respiratory effort, no problems with respiration noted  Abdomen: Soft, gravid, appropriate for gestational age.  Pain/Pressure: Present     Pelvic: Cervical exam deferred        Extremities: Normal range of motion.  Edema: None  Mental Status: Normal mood and affect. Normal behavior. Normal judgment and thought content.   Assessment and Plan:  Pregnancy: G1P0 at [redacted]w[redacted]d  1. [redacted] weeks gestation of pregnancy   2. Gestational diabetes mellitus (GDM), antepartum, gestational diabetes method of control unspecified  50% of blood sugars elevated Fasting 10/12 normal  9/12 after dinner BS elevated; 5/12 readings 140 or greater Diet high in carbohydrates  Encouraged walking everyday after dinner. Rx: Metformin 500 mg BID Needs to start weekly antenatal testing. Needs growth [place] this week for  next.   Lengthy discussion about importance of testing blood sugars, bringing log book and keeping appointments. Discussed risks of uncontrolled DM in pregnancy including delayed fetal lung maturity, IUFD and shoulder dystocia possibly resulting brachial plexus palsy, brain damage, intrapartum death and extensive obstetric lacerations. Patient verbalizes understanding.   Preterm labor symptoms and general obstetric precautions including but not limited to vaginal bleeding, contractions, leaking of fluid and fetal movement were reviewed in detail with the patient. Please refer to After Visit Summary for other counseling recommendations.   Return for MD only, needs to start weekly antenatal testing. .  Future Appointments  Date Time Provider [place]  03/29/2020  1:30 PM Truett Mainland, DO [facility] None  04/06/2020  9:15 AM [facility] US2 [facility] [place], NP

## 2020-03-16 ENCOUNTER — Encounter: Payer: Self-pay | Admitting: *Deleted

## 2020-03-16 ENCOUNTER — Ambulatory Visit: Payer: 59 | Attending: Obstetrics and Gynecology

## 2020-03-16 ENCOUNTER — Other Ambulatory Visit: Payer: Self-pay | Admitting: *Deleted

## 2020-03-16 ENCOUNTER — Ambulatory Visit: Payer: 59 | Admitting: *Deleted

## 2020-03-16 ENCOUNTER — Other Ambulatory Visit: Payer: Self-pay

## 2020-03-16 DIAGNOSIS — Z3A33 33 weeks gestation of pregnancy: Secondary | ICD-10-CM

## 2020-03-16 DIAGNOSIS — D259 Leiomyoma of uterus, unspecified: Secondary | ICD-10-CM

## 2020-03-16 DIAGNOSIS — Z3400 Encounter for supervision of normal first pregnancy, unspecified trimester: Secondary | ICD-10-CM

## 2020-03-16 DIAGNOSIS — O24415 Gestational diabetes mellitus in pregnancy, controlled by oral hypoglycemic drugs: Secondary | ICD-10-CM

## 2020-03-16 DIAGNOSIS — Z362 Encounter for other antenatal screening follow-up: Secondary | ICD-10-CM | POA: Diagnosis not present

## 2020-03-16 DIAGNOSIS — O3413 Maternal care for benign tumor of corpus uteri, third trimester: Secondary | ICD-10-CM

## 2020-03-16 DIAGNOSIS — O24419 Gestational diabetes mellitus in pregnancy, unspecified control: Secondary | ICD-10-CM | POA: Insufficient documentation

## 2020-03-21 ENCOUNTER — Other Ambulatory Visit: Payer: Self-pay

## 2020-03-21 ENCOUNTER — Ambulatory Visit (INDEPENDENT_AMBULATORY_CARE_PROVIDER_SITE_OTHER): Payer: 59

## 2020-03-21 VITALS — BP 107/58 | HR 92 | Wt 131.0 lb

## 2020-03-21 DIAGNOSIS — Z3A34 34 weeks gestation of pregnancy: Secondary | ICD-10-CM | POA: Diagnosis not present

## 2020-03-21 DIAGNOSIS — O24415 Gestational diabetes mellitus in pregnancy, controlled by oral hypoglycemic drugs: Secondary | ICD-10-CM

## 2020-03-21 DIAGNOSIS — O24419 Gestational diabetes mellitus in pregnancy, unspecified control: Secondary | ICD-10-CM

## 2020-03-21 NOTE — Progress Notes (Signed)
Patient presents for NST (having weekly BPPs done with MFM) for GDM A2. Kathrene Alu RN

## 2020-03-21 NOTE — Progress Notes (Signed)
I have reviewed this chart and agree with the RN/CMA assessment and management.    K. Meryl Treyvonne Tata, M.D. Attending [facility] (Faculty Practice)   

## 2020-03-23 ENCOUNTER — Other Ambulatory Visit: Payer: Self-pay

## 2020-03-23 ENCOUNTER — Ambulatory Visit: Payer: 59 | Attending: Obstetrics and Gynecology

## 2020-03-23 ENCOUNTER — Ambulatory Visit: Payer: 59 | Admitting: *Deleted

## 2020-03-23 DIAGNOSIS — O24415 Gestational diabetes mellitus in pregnancy, controlled by oral hypoglycemic drugs: Secondary | ICD-10-CM | POA: Insufficient documentation

## 2020-03-23 DIAGNOSIS — Z3400 Encounter for supervision of normal first pregnancy, unspecified trimester: Secondary | ICD-10-CM

## 2020-03-23 DIAGNOSIS — Z362 Encounter for other antenatal screening follow-up: Secondary | ICD-10-CM

## 2020-03-23 DIAGNOSIS — Z3A34 34 weeks gestation of pregnancy: Secondary | ICD-10-CM

## 2020-03-26 ENCOUNTER — Encounter: Payer: 59 | Admitting: Family Medicine

## 2020-03-29 ENCOUNTER — Other Ambulatory Visit: Payer: Self-pay

## 2020-03-29 ENCOUNTER — Ambulatory Visit (INDEPENDENT_AMBULATORY_CARE_PROVIDER_SITE_OTHER): Payer: 59 | Admitting: Family Medicine

## 2020-03-29 VITALS — BP 119/74 | HR 88 | Wt 132.0 lb

## 2020-03-29 DIAGNOSIS — Z3400 Encounter for supervision of normal first pregnancy, unspecified trimester: Secondary | ICD-10-CM

## 2020-03-29 DIAGNOSIS — Z3A35 35 weeks gestation of pregnancy: Secondary | ICD-10-CM | POA: Diagnosis not present

## 2020-03-29 DIAGNOSIS — O24419 Gestational diabetes mellitus in pregnancy, unspecified control: Secondary | ICD-10-CM

## 2020-03-29 DIAGNOSIS — O24415 Gestational diabetes mellitus in pregnancy, controlled by oral hypoglycemic drugs: Secondary | ICD-10-CM

## 2020-03-29 NOTE — Progress Notes (Signed)
Subjective:  Crystal Joseph is a 29 y.o. G1P0 at [redacted]w[redacted]d being seen today for ongoing prenatal care.  She is currently monitored for the following issues for this high-risk pregnancy and has Supervision of normal first pregnancy, antepartum; Anxiety state; Esophageal reflux; Hemorrhoids; GDM (gestational diabetes mellitus); and Uterine fibroid in pregnancy on their problem list.  GDM: Patient taking metformin 500mg  BID.  Reports no hypoglycemic episodes.  Tolerating medication well Fasting: mostly controleld 2hr PP: several elevated CBGs in the beginning of the week, more controlled now.  Patient reports no complaints.  Contractions: Not present. Vag. Bleeding: None.  Movement: Present. Denies leaking of fluid.   The following portions of the patient's history were reviewed and updated as appropriate: allergies, current medications, past family history, past medical history, past social history, past surgical history and problem list. Problem list updated.  Objective:   Vitals:   03/29/20 1329  BP: 119/74  Pulse: 88  Weight: 132 lb (59.9 kg)    Fetal Status: Fetal Heart Rate (bpm): NST   Movement: Present     General:  Alert, oriented and cooperative. Patient is in no acute distress.  Skin: Skin is warm and dry. No rash noted.   Cardiovascular: Normal heart rate noted  Respiratory: Normal respiratory effort, no problems with respiration noted  Abdomen: Soft, gravid, appropriate for gestational age. Pain/Pressure: Present     Pelvic: Vag. Bleeding: None     Cervical exam deferred        Extremities: Normal range of motion.  Edema: None  Mental Status: Normal mood and affect. Normal behavior. Normal judgment and thought content.   Urinalysis:      Assessment and Plan:  Pregnancy: G1P0 at [redacted]w[redacted]d  1. [redacted] weeks gestation of pregnancy  2. Supervision of normal first pregnancy, antepartum FHT and FH normal  3. GDM, class A2 Continue metformin. Continue BPP/NST NST reactive  today. Induce at 39 weeks.   Preterm labor symptoms and general obstetric precautions including but not limited to vaginal bleeding, contractions, leaking of fluid and fetal movement were reviewed in detail with the patient. Please refer to After Visit Summary for other counseling recommendations.  No follow-ups on file.   Truett Mainland, DO

## 2020-03-30 ENCOUNTER — Ambulatory Visit: Payer: 59

## 2020-03-30 ENCOUNTER — Ambulatory Visit: Payer: 59 | Attending: Obstetrics and Gynecology

## 2020-03-30 DIAGNOSIS — Z362 Encounter for other antenatal screening follow-up: Secondary | ICD-10-CM

## 2020-03-30 DIAGNOSIS — Z3A35 35 weeks gestation of pregnancy: Secondary | ICD-10-CM | POA: Diagnosis not present

## 2020-03-30 DIAGNOSIS — O24415 Gestational diabetes mellitus in pregnancy, controlled by oral hypoglycemic drugs: Secondary | ICD-10-CM | POA: Diagnosis not present

## 2020-03-30 MED FILL — FREESTYLE LITE TEST STRIP: 25 days supply | Qty: 100 | Fill #2

## 2020-03-30 MED FILL — FREESTYLE LANCETS: 25 days supply | Qty: 100 | Fill #2

## 2020-04-03 ENCOUNTER — Ambulatory Visit (INDEPENDENT_AMBULATORY_CARE_PROVIDER_SITE_OTHER): Payer: 59 | Admitting: Advanced Practice Midwife

## 2020-04-03 ENCOUNTER — Encounter: Payer: Self-pay | Admitting: Advanced Practice Midwife

## 2020-04-03 ENCOUNTER — Other Ambulatory Visit: Payer: Self-pay

## 2020-04-03 ENCOUNTER — Other Ambulatory Visit (HOSPITAL_COMMUNITY)
Admission: RE | Admit: 2020-04-03 | Discharge: 2020-04-03 | Disposition: A | Discharge status: Home / Self Care | Payer: 59 | Source: Ambulatory Visit | Attending: Advanced Practice Midwife | Admitting: Advanced Practice Midwife

## 2020-04-03 VITALS — BP 105/72 | HR 87 | Wt 131.0 lb

## 2020-04-03 DIAGNOSIS — Z3A36 36 weeks gestation of pregnancy: Secondary | ICD-10-CM | POA: Insufficient documentation

## 2020-04-03 DIAGNOSIS — O0993 Supervision of high risk pregnancy, unspecified, third trimester: Secondary | ICD-10-CM | POA: Insufficient documentation

## 2020-04-03 DIAGNOSIS — O24415 Gestational diabetes mellitus in pregnancy, controlled by oral hypoglycemic drugs: Secondary | ICD-10-CM | POA: Diagnosis not present

## 2020-04-03 DIAGNOSIS — O0990 Supervision of high risk pregnancy, unspecified, unspecified trimester: Secondary | ICD-10-CM | POA: Diagnosis not present

## 2020-04-03 NOTE — Progress Notes (Signed)
   PRENATAL VISIT NOTE  Subjective:  Crystal Joseph is a 29 y.o. G1P0 at [redacted]w[redacted]d being seen today for ongoing prenatal care.  She is currently monitored for the following issues for this high-risk pregnancy and has Supervision of normal first pregnancy, antepartum; Anxiety state; Esophageal reflux; Hemorrhoids; GDM (gestational diabetes mellitus); and Uterine fibroid in pregnancy on their problem list.  Patient reports no complaints and Blood sugar levels are much better with only one value of 128 in entire week.  Contractions: Not present. Vag. Bleeding: None.  Movement: Present. Denies leaking of fluid.   The following portions of the patient's history were reviewed and updated as appropriate: allergies, current medications, past family history, past medical history, past social history, past surgical history and problem list.   Objective:   Vitals:   04/03/20 1036  BP: 105/72  Pulse: 87  Weight: 131 lb (59.4 kg)    Fetal Status: Fetal Heart Rate (bpm): NST   Movement: Present     General:  Alert, oriented and cooperative. Patient is in no acute distress.  Skin: Skin is warm and dry. No rash noted.   Cardiovascular: Normal heart rate noted  Respiratory: Normal respiratory effort, no problems with respiration noted  Abdomen: Soft, gravid, appropriate for gestational age.  Pain/Pressure: Present     Pelvic: Cervical exam performed in the presence of a chaperone        Cervix FT/30%/-3/vertex  Extremities: Normal range of motion.  Edema: None  Mental Status: Normal mood and affect. Normal behavior. Normal judgment and thought content.   Assessment and Plan:  Pregnancy: G1P0 at [redacted]w[redacted]d 1. [redacted] weeks gestation of pregnancy  - Culture, beta strep (group b only) - GC/Chlamydia probe amp ([place])not at [facility]  2. Supervision of high risk pregnancy, antepartum  - Culture, beta strep (group b only) - GC/Chlamydia probe amp ([place])not at [facility]  3. Gestational diabetes mellitus (GDM)  in third trimester controlled on oral hypoglycemic drug      Continue diet and Metformin  - Culture, beta strep (group b only) - GC/Chlamydia probe amp ([place])not at [facility]  Preterm labor symptoms and general obstetric precautions including but not limited to vaginal bleeding, contractions, leaking of fluid and fetal movement were reviewed in detail with the patient. Please refer to After Visit Summary for other counseling recommendations.   Return in about 1 week (around 04/10/2020) for [facility]  Future Appointments  Date Time Provider [place]  04/06/2020  9:00 AM [facility] NURSE [facility]  04/06/2020  9:15 AM [facility] US2 [facility]  04/11/2020  7:45 AM [facility] NURSE [facility]  04/11/2020  8:00 AM [facility] US1 [facility]  04/11/2020  1:00 PM Lavonia Drafts, MD [facility] None  04/19/2020  8:30 AM Lavonia Drafts, MD [facility] None  07/18/2020  8:30 AM Cirigliano, Garvin Fila, DO [facility]    Hansel Feinstein, CNM

## 2020-04-03 NOTE — Patient Instructions (Signed)
Third Trimester of Pregnancy The third trimester is from week 28 through week 40 (months 7 through 9). The third trimester is a time when the unborn baby (fetus) is growing rapidly. At the end of the ninth month, the fetus is about 20 inches in length and weighs 6-10 pounds. Body changes during your third trimester Your body will continue to go through many changes during pregnancy. The changes vary from woman to woman. During the third trimester:  Your weight will continue to increase. You can expect to gain 25-35 pounds (11-16 kg) by the end of the pregnancy.  You may begin to get stretch marks on your hips, abdomen, and breasts.  You may urinate more often because the fetus is moving lower into your pelvis and pressing on your bladder.  You may develop or continue to have heartburn. This is caused by increased hormones that slow down muscles in the digestive tract.  You may develop or continue to have constipation because increased hormones slow digestion and cause the muscles that push waste through your intestines to relax.  You may develop hemorrhoids. These are swollen veins (varicose veins) in the rectum that can itch or be painful.  You may develop swollen, bulging veins (varicose veins) in your legs.  You may have increased body aches in the pelvis, back, or thighs. This is due to weight gain and increased hormones that are relaxing your joints.  You may have changes in your hair. These can include thickening of your hair, rapid growth, and changes in texture. Some women also have hair loss during or after pregnancy, or hair that feels dry or thin. Your hair will most likely return to normal after your baby is born.  Your breasts will continue to grow and they will continue to become tender. A yellow fluid (colostrum) may leak from your breasts. This is the first milk you are producing for your baby.  Your belly button may stick out.  You may notice more swelling in your hands,  face, or ankles.  You may have increased tingling or numbness in your hands, arms, and legs. The skin on your belly may also feel numb.  You may feel short of breath because of your expanding uterus.  You may have more problems sleeping. This can be caused by the size of your belly, increased need to urinate, and an increase in your body's metabolism.  You may notice the fetus "dropping," or moving lower in your abdomen (lightening).  You may have increased vaginal discharge.  You may notice your joints feel loose and you may have pain around your pelvic bone. What to expect at prenatal visits You will have prenatal exams every 2 weeks until week 36. Then you will have weekly prenatal exams. During a routine prenatal visit:  You will be weighed to make sure you and the baby are growing normally.  Your blood pressure will be taken.  Your abdomen will be measured to track your baby's growth.  The fetal heartbeat will be listened to.  Any test results from the previous visit will be discussed.  You may have a cervical check near your due date to see if your cervix has softened or thinned (effaced).  You will be tested for Group B streptococcus. This happens between 35 and 37 weeks. Your health care provider may ask you:  What your birth plan is.  How you are feeling.  If you are feeling the baby move.  If you have had any abnormal   symptoms, such as leaking fluid, bleeding, severe headaches, or abdominal cramping.  If you are using any tobacco products, including cigarettes, chewing tobacco, and electronic cigarettes.  If you have any questions. Other tests or screenings that may be performed during your third trimester include:  Blood tests that check for low iron levels (anemia).  Fetal testing to check the health, activity level, and growth of the fetus. Testing is done if you have certain medical conditions or if there are problems during the pregnancy.  Nonstress test  (NST). This test checks the health of your baby to make sure there are no signs of problems, such as the baby not getting enough oxygen. During this test, a belt is placed around your belly. The baby is made to move, and its heart rate is monitored during movement. What is false labor? False labor is a condition in which you feel small, irregular tightenings of the muscles in the womb (contractions) that usually go away with rest, changing position, or drinking water. These are called Braxton Hicks contractions. Contractions may last for hours, days, or even weeks before true labor sets in. If contractions come at regular intervals, become more frequent, increase in intensity, or become painful, you should see your health care provider. What are the signs of labor?  Abdominal cramps.  Regular contractions that start at 10 minutes apart and become stronger and more frequent with time.  Contractions that start on the top of the uterus and spread down to the lower abdomen and back.  Increased pelvic pressure and dull back pain.  A watery or bloody mucus discharge that comes from the vagina.  Leaking of amniotic fluid. This is also known as your "water breaking." It could be a slow trickle or a gush. Let your health care provider know if it has a color or strange odor. If you have any of these signs, call your health care provider right away, even if it is before your due date. Follow these instructions at home: Medicines  Follow your health care provider's instructions regarding medicine use. Specific medicines may be either safe or unsafe to take during pregnancy.  Take a prenatal vitamin that contains at least 600 micrograms (mcg) of folic acid.  If you develop constipation, try taking a stool softener if your health care provider approves. Eating and drinking   Eat a balanced diet that includes fresh fruits and vegetables, whole grains, good sources of protein such as meat, eggs, or tofu,  and low-fat dairy. Your health care provider will help you determine the amount of weight gain that is right for you.  Avoid raw meat and uncooked cheese. These carry germs that can cause birth defects in the baby.  If you have low calcium intake from food, talk to your health care provider about whether you should take a daily calcium supplement.  Eat four or five small meals rather than three large meals a day.  Limit foods that are high in fat and processed sugars, such as fried and sweet foods.  To prevent constipation: ? Drink enough fluid to keep your urine clear or pale yellow. ? Eat foods that are high in fiber, such as fresh fruits and vegetables, whole grains, and beans. Activity  Exercise only as directed by your health care provider. Most women can continue their usual exercise routine during pregnancy. Try to exercise for 30 minutes at least 5 days a week. Stop exercising if you experience uterine contractions.  Avoid heavy lifting.  Do   not exercise in extreme heat or humidity, or at high altitudes.  Wear low-heel, comfortable shoes.  Practice good posture.  You may continue to have sex unless your health care provider tells you otherwise. Relieving pain and discomfort  Take frequent breaks and rest with your legs elevated if you have leg cramps or low back pain.  Take warm sitz baths to soothe any pain or discomfort caused by hemorrhoids. Use hemorrhoid cream if your health care provider approves.  Wear a good support bra to prevent discomfort from breast tenderness.  If you develop varicose veins: ? Wear support pantyhose or compression stockings as told by your healthcare provider. ? Elevate your feet for 15 minutes, 3-4 times a day. Prenatal care  Write down your questions. Take them to your prenatal visits.  Keep all your prenatal visits as told by your health care provider. This is important. Safety  Wear your seat belt at all times when driving.  Make  a list of emergency phone numbers, including numbers for family, friends, the hospital, and police and fire departments. General instructions  Avoid cat litter boxes and soil used by cats. These carry germs that can cause birth defects in the baby. If you have a cat, ask someone to clean the litter box for you.  Do not travel far distances unless it is absolutely necessary and only with the approval of your health care provider.  Do not use hot tubs, steam rooms, or saunas.  Do not drink alcohol.  Do not use any products that contain nicotine or tobacco, such as cigarettes and e-cigarettes. If you need help quitting, ask your health care provider.  Do not use any medicinal herbs or unprescribed drugs. These chemicals affect the formation and growth of the baby.  Do not douche or use tampons or scented sanitary pads.  Do not cross your legs for long periods of time.  To prepare for the arrival of your baby: ? Take prenatal classes to understand, practice, and ask questions about labor and delivery. ? Make a trial run to the hospital. ? Visit the hospital and tour the maternity area. ? Arrange for maternity or paternity leave through employers. ? Arrange for family and friends to take care of pets while you are in the hospital. ? Purchase a rear-facing car seat and make sure you know how to install it in your car. ? Pack your hospital bag. ? Prepare the baby's nursery. Make sure to remove all pillows and stuffed animals from the baby's crib to prevent suffocation.  Visit your dentist if you have not gone during your pregnancy. Use a soft toothbrush to brush your teeth and be gentle when you floss. Contact a health care provider if:  You are unsure if you are in labor or if your water has broken.  You become dizzy.  You have mild pelvic cramps, pelvic pressure, or nagging pain in your abdominal area.  You have lower back pain.  You have persistent nausea, vomiting, or  diarrhea.  You have an unusual or bad smelling vaginal discharge.  You have pain when you urinate. Get help right away if:  Your water breaks before 37 weeks.  You have regular contractions less than 5 minutes apart before 37 weeks.  You have a fever.  You are leaking fluid from your vagina.  You have spotting or bleeding from your vagina.  You have severe abdominal pain or cramping.  You have rapid weight loss or weight gain.  You have   shortness of breath with chest pain.  You notice sudden or extreme swelling of your face, hands, ankles, feet, or legs.  Your baby makes fewer than 10 movements in 2 hours.  You have severe headaches that do not go away when you take medicine.  You have vision changes. Summary  The third trimester is from week 28 through week 40, months 7 through 9. The third trimester is a time when the unborn baby (fetus) is growing rapidly.  During the third trimester, your discomfort may increase as you and your baby continue to gain weight. You may have abdominal, leg, and back pain, sleeping problems, and an increased need to urinate.  During the third trimester your breasts will keep growing and they will continue to become tender. A yellow fluid (colostrum) may leak from your breasts. This is the first milk you are producing for your baby.  False labor is a condition in which you feel small, irregular tightenings of the muscles in the womb (contractions) that eventually go away. These are called Braxton Hicks contractions. Contractions may last for hours, days, or even weeks before true labor sets in.  Signs of labor can include: abdominal cramps; regular contractions that start at 10 minutes apart and become stronger and more frequent with time; watery or bloody mucus discharge that comes from the vagina; increased pelvic pressure and dull back pain; and leaking of amniotic fluid. This information is not intended to replace advice given to you by your  health care provider. Make sure you discuss any questions you have with your health care provider. Document Revised: 11/11/2018 Document Reviewed: 08/26/2016 [employer] Patient Education  2020 [employer]  

## 2020-04-04 LAB — GC/CHLAMYDIA PROBE AMP (~~LOC~~) NOT AT ARMC
Chlamydia: NEGATIVE
Comment: NEGATIVE
Comment: NORMAL
Neisseria Gonorrhea: NEGATIVE

## 2020-04-06 ENCOUNTER — Other Ambulatory Visit: Payer: Self-pay

## 2020-04-06 ENCOUNTER — Ambulatory Visit: Payer: 59 | Attending: Obstetrics and Gynecology

## 2020-04-06 ENCOUNTER — Ambulatory Visit: Payer: 59 | Admitting: *Deleted

## 2020-04-06 DIAGNOSIS — D259 Leiomyoma of uterus, unspecified: Secondary | ICD-10-CM | POA: Insufficient documentation

## 2020-04-06 DIAGNOSIS — Z3400 Encounter for supervision of normal first pregnancy, unspecified trimester: Secondary | ICD-10-CM | POA: Diagnosis not present

## 2020-04-06 DIAGNOSIS — O24415 Gestational diabetes mellitus in pregnancy, controlled by oral hypoglycemic drugs: Secondary | ICD-10-CM | POA: Diagnosis not present

## 2020-04-06 DIAGNOSIS — O3413 Maternal care for benign tumor of corpus uteri, third trimester: Secondary | ICD-10-CM

## 2020-04-06 DIAGNOSIS — O3410 Maternal care for benign tumor of corpus uteri, unspecified trimester: Secondary | ICD-10-CM | POA: Diagnosis not present

## 2020-04-06 DIAGNOSIS — Z3A36 36 weeks gestation of pregnancy: Secondary | ICD-10-CM | POA: Diagnosis not present

## 2020-04-06 DIAGNOSIS — Z362 Encounter for other antenatal screening follow-up: Secondary | ICD-10-CM

## 2020-04-07 LAB — CULTURE, BETA STREP (GROUP B ONLY): Strep Gp B Culture: NEGATIVE

## 2020-04-11 ENCOUNTER — Other Ambulatory Visit: Payer: Self-pay

## 2020-04-11 ENCOUNTER — Ambulatory Visit: Payer: 59 | Attending: Obstetrics and Gynecology

## 2020-04-11 ENCOUNTER — Ambulatory Visit: Payer: 59 | Admitting: *Deleted

## 2020-04-11 ENCOUNTER — Encounter: Payer: Self-pay | Admitting: *Deleted

## 2020-04-11 ENCOUNTER — Other Ambulatory Visit: Payer: Self-pay | Admitting: *Deleted

## 2020-04-11 ENCOUNTER — Ambulatory Visit (INDEPENDENT_AMBULATORY_CARE_PROVIDER_SITE_OTHER): Payer: 59 | Admitting: Obstetrics & Gynecology

## 2020-04-11 VITALS — BP 104/61 | HR 71 | Wt 130.0 lb

## 2020-04-11 DIAGNOSIS — O3413 Maternal care for benign tumor of corpus uteri, third trimester: Secondary | ICD-10-CM

## 2020-04-11 DIAGNOSIS — O24415 Gestational diabetes mellitus in pregnancy, controlled by oral hypoglycemic drugs: Secondary | ICD-10-CM | POA: Insufficient documentation

## 2020-04-11 DIAGNOSIS — Z3400 Encounter for supervision of normal first pregnancy, unspecified trimester: Secondary | ICD-10-CM

## 2020-04-11 DIAGNOSIS — Z362 Encounter for other antenatal screening follow-up: Secondary | ICD-10-CM | POA: Diagnosis not present

## 2020-04-11 DIAGNOSIS — Z3A37 37 weeks gestation of pregnancy: Secondary | ICD-10-CM

## 2020-04-11 DIAGNOSIS — O3410 Maternal care for benign tumor of corpus uteri, unspecified trimester: Secondary | ICD-10-CM

## 2020-04-11 DIAGNOSIS — D259 Leiomyoma of uterus, unspecified: Secondary | ICD-10-CM | POA: Diagnosis not present

## 2020-04-11 DIAGNOSIS — O24410 Gestational diabetes mellitus in pregnancy, diet controlled: Secondary | ICD-10-CM

## 2020-04-11 MED FILL — METFORMIN HCL 500 MG TABS: 500 | 30 days supply | Qty: 60 | Fill #1

## 2020-04-11 NOTE — Progress Notes (Signed)
° °  PRENATAL VISIT NOTE  Subjective:  Crystal Joseph is a 29 y.o. G1P0 at [redacted]w[redacted]d being seen today for ongoing prenatal care.  She is currently monitored for the following issues for this high-risk pregnancy and has Supervision of normal first pregnancy, antepartum; Anxiety state; Esophageal reflux; Hemorrhoids; GDM (gestational diabetes mellitus); and Uterine fibroid in pregnancy on their problem list.  Patient reports no complaints.  Contractions: Irritability. Vag. Bleeding: None.  Movement: Present. Denies leaking of fluid.   The following portions of the patient's history were reviewed and updated as appropriate: allergies, current medications, past family history, past medical history, past social history, past surgical history and problem list.   Objective:   Vitals:   04/11/20 1305  BP: 104/61  Pulse: 71  Weight: 130 lb (59 kg)    Fetal Status:     Movement: Present     General:  Alert, oriented and cooperative. Patient is in no acute distress.  Skin: Skin is warm and dry. No rash noted.   Cardiovascular: Normal heart rate noted  Respiratory: Normal respiratory effort, no problems with respiration noted  Abdomen: Soft, gravid, appropriate for gestational age.  Pain/Pressure: Present     Pelvic: Cervical exam deferred        Extremities: Normal range of motion.  Edema: None  Mental Status: Normal mood and affect. Normal behavior. Normal judgment and thought content.   Assessment and Plan:  Pregnancy: G1P0 at [redacted]w[redacted]d 1. [redacted] weeks gestation of pregnancy Had US done today. Results pending   2. Supervision of normal first pregnancy, antepartum NST reviewed and reactive.  3. Diet controlled gestational diabetes mellitus (GDM) in third trimester On Metformin bid. All except for 1 fasting level was WNL.    4. Uterine fibroid in pregnancy  Preterm labor symptoms and general obstetric precautions including but not limited to vaginal bleeding, contractions, leaking of fluid and fetal  movement were reviewed in detail with the patient. Please refer to After Visit Summary for other counseling recommendations.   Return in about 1 week (around 04/18/2020).  Future Appointments  Date Time Provider [place]  04/18/2020  3:15 PM [facility] US2 [facility]  04/19/2020  8:30 AM Lavonia Drafts, MD [facility] None  07/18/2020  8:30 AM Cirigliano, Garvin Fila, DO [id number]    Lavonia Drafts, MD

## 2020-04-11 NOTE — Patient Instructions (Addendum)
Vaginal Delivery  Vaginal delivery means that you give birth by pushing your baby out of your birth canal (vagina). A team of health care providers will help you before, during, and after vaginal delivery. Birth experiences are unique for every woman and every pregnancy, and birth experiences vary depending on where you choose to give birth. What happens when I arrive at the birth center or hospital? Once you are in labor and have been admitted into the hospital or birth center, your health care provider may:  Review your pregnancy history and any concerns that you have.  Insert an IV into one of your veins. This may be used to give you fluids and medicines.  Check your blood pressure, pulse, temperature, and heart rate (vital signs).  Check whether your bag of water (amniotic sac) has broken (ruptured).  Talk with you about your birth plan and discuss pain control options. Monitoring Your health care provider may monitor your contractions (uterine monitoring) and your baby's heart rate (fetal monitoring). You may need to be monitored:  Often, but not continuously (intermittently).  All the time or for long periods at a time (continuously). Continuous monitoring may be needed if: ? You are taking certain medicines, such as medicine to relieve pain or make your contractions stronger. ? You have pregnancy or labor complications. Monitoring may be done by:  Placing a special stethoscope or a handheld monitoring device on your abdomen to check your baby's heartbeat and to check for contractions.  Placing monitors on your abdomen (external monitors) to record your baby's heartbeat and the frequency and length of contractions.  Placing monitors inside your uterus through your vagina (internal monitors) to record your baby's heartbeat and the frequency, length, and strength of your contractions. Depending on the type of monitor, it may remain in your uterus or on your baby's head until  birth.  Telemetry. This is a type of continuous monitoring that can be done with external or internal monitors. Instead of having to stay in bed, you are able to move around during telemetry. Physical exam Your health care provider may perform frequent physical exams. This may include:  Checking how and where your baby is positioned in your uterus.  Checking your cervix to determine: ? Whether it is thinning out (effacing). ? Whether it is opening up (dilating). What happens during labor and delivery?  Normal labor and delivery is divided into the following three stages: Stage 1  This is the longest stage of labor.  This stage can last for hours or days.  Throughout this stage, you will feel contractions. Contractions generally feel mild, infrequent, and irregular at first. They get stronger, more frequent (about every 2-3 minutes), and more regular as you move through this stage.  This stage ends when your cervix is completely dilated to 4 inches (10 cm) and completely effaced. Stage 2  This stage starts once your cervix is completely effaced and dilated and lasts until the delivery of your baby.  This stage may last from 20 minutes to 2 hours.  This is the stage where you will feel an urge to push your baby out of your vagina.  You may feel stretching and burning pain, especially when the widest part of your baby's head passes through the vaginal opening (crowning).  Once your baby is delivered, the umbilical cord will be clamped and cut. This usually occurs after waiting a period of 1-2 minutes after delivery.  Your baby will be placed on your bare chest (  skin-to-skin contact) in an upright position and covered with a warm blanket. Watch your baby for feeding cues, like rooting or sucking, and help the baby to your breast for his or her first feeding. Stage 3  This stage starts immediately after the birth of your baby and ends after you deliver the placenta.  This stage may  take anywhere from 5 to 30 minutes.  After your baby has been delivered, you will feel contractions as your body expels the placenta and your uterus contracts to control bleeding. What can I expect after labor and delivery?  After labor is over, you and your baby will be monitored closely until you are ready to go home to ensure that you are both healthy. Your health care team will teach you how to care for yourself and your baby.  You and your baby will stay in the same room (rooming in) during your hospital stay. This will encourage early bonding and successful breastfeeding.  You may continue to receive fluids and medicines through an IV.  Your uterus will be checked and massaged regularly (fundal massage).  You will have some soreness and pain in your abdomen, vagina, and the area of skin between your vaginal opening and your anus (perineum).  If an incision was made near your vagina (episiotomy) or if you had some vaginal tearing during delivery, cold compresses may be placed on your episiotomy or your tear. This helps to reduce pain and swelling.  You may be given a squirt bottle to use instead of wiping when you go to the bathroom. To use the squirt bottle, follow these steps: ? Before you urinate, fill the squirt bottle with warm water. Do not use hot water. ? After you urinate, while you are sitting on the toilet, use the squirt bottle to rinse the area around your urethra and vaginal opening. This rinses away any urine and blood. ? Fill the squirt bottle with clean water every time you use the bathroom.  It is normal to have vaginal bleeding after delivery. Wear a sanitary pad for vaginal bleeding and discharge. Summary  Vaginal delivery means that you will give birth by pushing your baby out of your birth canal (vagina).  Your health care provider may monitor your contractions (uterine monitoring) and your baby's heart rate (fetal monitoring).  Your health care provider may  perform a physical exam.  Normal labor and delivery is divided into three stages.  After labor is over, you and your baby will be monitored closely until you are ready to go home. This information is not intended to replace advice given to you by your health care provider. Make sure you discuss any questions you have with your health care provider. Document Revised: 08/25/2017 Document Reviewed: 08/25/2017 Elsevier Patient Education  [place].  Pain Relief During Labor and Delivery Many things can cause pain during labor and delivery, including:  Pressure on bones and ligaments due to the baby moving through the pelvis.  Stretching of tissues due to the baby moving through the birth canal.  Muscle tension due to anxiety or nervousness.  The uterus tightening (contracting) and relaxing to help move the baby. There are many ways to deal with the pain of labor and delivery. They include:  Taking prenatal classes. Taking these classes helps you know what to expect during your baby's birth. What you learn will increase your confidence and decrease your anxiety.  Practicing relaxation techniques or doing relaxing activities, such as: ? Focused breathing. ?  Meditation. ? Visualization. ? Aroma therapy. ? Listening to your favorite music. ? Hypnosis.  Taking a warm shower or bath (hydrotherapy). This may: ? Provide comfort and relaxation. ? Lessen your perception of pain. ? Decrease the amount of pain medicine needed. ? Decrease the length of labor.  Getting a massage or counterpressure on your back.  Applying warm packs or ice packs.  Changing positions often, moving around, or using a birthing ball.  Getting: ? Pain medicine through an IV or injection into a muscle. ? Pain medicine inserted into your spinal column. ? Injections of sterile water just under the skin on your lower back (intradermal injections). ? Laughing gas (nitrous oxide). Discuss your pain control  options with your health care provider during your prenatal visits. Explore the options offered by your hospital or birth center. What kinds of medicine are available? There are two kinds of medicines that can be used to relieve pain during labor and delivery:  Analgesics. These medicines decrease pain without causing you to lose feeling or the ability to move your muscles.  Anesthetics. These medicines block feeling in the body and can decrease your ability to move freely. Both of these kinds of medicine can cause minor side effects, such as nausea, trouble concentrating, and sleepiness. They can also decrease the baby's heart rate before birth and affect the baby's breathing rate after birth. For this reason, health care providers are careful about when and how much medicine is given. What are specific medicines and procedures that provide pain relief? Local Anesthetics Local anesthetics are used to numb a small area of the body. They may be used along with another kind of anesthetic or used to numb the nerves of the vagina, cervix, and perineum during the second stage of labor. General Anesthetics General anesthetics cause you to lose consciousness so you do not feel pain. They are usually only used for an emergency cesarean delivery. General anesthetics are given through an IV tube and a mask. Pudendal Block A pudendal block is a form of local anesthetic. It may be used to relieve the pain associated with pushing or stretching of the perineum at the time of delivery or to further numb the perineum. A pudendal block is done by injecting numbing medicine through the vaginal wall into a nerve in the pelvis. Epidural Analgesia Epidural analgesia is given through a flexible IV catheter that is inserted into the lower back. Numbing medicine is delivered continuously to the area near your spinal column nerves (epidural space). After having this type of analgesia, you may be able to move your legs but  you most likely will not be able to walk. Depending on the amount of medicine given, you may lose all feeling in the lower half of your body, or you may retain some level of sensation, including the urge to push. Epidural analgesia can be used to provide pain relief for a vaginal birth. Spinal Block A spinal block is similar to epidural analgesia, but the medicine is injected into the spinal fluid instead of the epidural space. A spinal block is only given once. It starts to relieve pain quickly, but the pain relief lasts only 1-6 hours. Spinal blocks can be used for cesarean deliveries. Combined Spinal-Epidural (CSE) Block A CSE block combines the effects of a spinal block and epidural analgesia. The spinal block works quickly to block all pain. The epidural analgesia provides continuous pain relief, even after the effects of the spinal block have worn off. This information  is not intended to replace advice given to you by your health care provider. Make sure you discuss any questions you have with your health care provider. Document Revised: 07/03/2017 Document Reviewed: 12/12/2015 Elsevier Patient Education  [place].

## 2020-04-18 ENCOUNTER — Inpatient Hospital Stay (HOSPITAL_COMMUNITY)
Admission: AD | Admit: 2020-04-18 | Discharge: 2020-04-21 | DRG: 788 | Disposition: A | Discharge status: Home / Self Care | Payer: 59 | Attending: Obstetrics & Gynecology | Admitting: Obstetrics & Gynecology

## 2020-04-18 ENCOUNTER — Ambulatory Visit: Payer: 59

## 2020-04-18 ENCOUNTER — Other Ambulatory Visit: Payer: Self-pay

## 2020-04-18 ENCOUNTER — Encounter (HOSPITAL_COMMUNITY): Payer: Self-pay | Admitting: Obstetrics & Gynecology

## 2020-04-18 DIAGNOSIS — Z3A38 38 weeks gestation of pregnancy: Secondary | ICD-10-CM | POA: Diagnosis not present

## 2020-04-18 DIAGNOSIS — Z20822 Contact with and (suspected) exposure to covid-19: Secondary | ICD-10-CM | POA: Diagnosis present

## 2020-04-18 DIAGNOSIS — O24425 Gestational diabetes mellitus in childbirth, controlled by oral hypoglycemic drugs: Secondary | ICD-10-CM | POA: Diagnosis present

## 2020-04-18 DIAGNOSIS — O4292 Full-term premature rupture of membranes, unspecified as to length of time between rupture and onset of labor: Secondary | ICD-10-CM | POA: Diagnosis present

## 2020-04-18 DIAGNOSIS — O3413 Maternal care for benign tumor of corpus uteri, third trimester: Secondary | ICD-10-CM | POA: Diagnosis not present

## 2020-04-18 DIAGNOSIS — O24419 Gestational diabetes mellitus in pregnancy, unspecified control: Secondary | ICD-10-CM | POA: Diagnosis present

## 2020-04-18 DIAGNOSIS — O0990 Supervision of high risk pregnancy, unspecified, unspecified trimester: Secondary | ICD-10-CM

## 2020-04-18 DIAGNOSIS — O4290 Premature rupture of membranes, unspecified as to length of time between rupture and onset of labor, unspecified weeks of gestation: Secondary | ICD-10-CM | POA: Diagnosis present

## 2020-04-18 DIAGNOSIS — D259 Leiomyoma of uterus, unspecified: Secondary | ICD-10-CM | POA: Diagnosis present

## 2020-04-18 DIAGNOSIS — O24429 Gestational diabetes mellitus in childbirth, unspecified control: Secondary | ICD-10-CM | POA: Diagnosis not present

## 2020-04-18 HISTORY — DX: Gestational diabetes mellitus in pregnancy, unspecified control: O24.419

## 2020-04-18 LAB — CBC
HCT: 38.8 % (ref 36.0–46.0)
Hemoglobin: 12.5 g/dL (ref 12.0–15.0)
MCH: 28.9 pg (ref 26.0–34.0)
MCHC: 32.2 g/dL (ref 30.0–36.0)
MCV: 89.6 fL (ref 80.0–100.0)
Platelets: 273 10*3/uL (ref 150–400)
RBC: 4.33 MIL/uL (ref 3.87–5.11)
RDW: 14.2 % (ref 11.5–15.5)
WBC: 8.2 10*3/uL (ref 4.0–10.5)
nRBC: 0 % (ref 0.0–0.2)

## 2020-04-18 LAB — GLUCOSE, CAPILLARY: Glucose-Capillary: 66 mg/dL — ABNORMAL LOW (ref 70–99)

## 2020-04-18 LAB — TYPE AND SCREEN
ABO/RH(D): O POS
Antibody Screen: NEGATIVE

## 2020-04-18 LAB — SARS CORONAVIRUS 2 BY RT PCR (HOSPITAL ORDER, PERFORMED IN ~~LOC~~ HOSPITAL LAB): SARS Coronavirus 2: NEGATIVE

## 2020-04-18 LAB — POCT FERN TEST: POCT Fern Test: POSITIVE

## 2020-04-18 MED ORDER — MISOPROSTOL 50MCG HALF TABLET: 50.0000 ug | ORAL_TABLET | Freq: Once | ORAL | Status: AC

## 2020-04-18 MED ORDER — LACTATED RINGERS IV SOLN: INTRAVENOUS | Status: DC

## 2020-04-18 MED ORDER — OXYCODONE-ACETAMINOPHEN 5-325 MG PO TABS: 1.0000 | ORAL_TABLET | ORAL | Status: DC | PRN

## 2020-04-18 MED ORDER — SOD CITRATE-CITRIC ACID 500-334 MG/5ML PO SOLN: 30.0000 mL | ORAL | Status: DC | PRN

## 2020-04-18 MED ORDER — OXYTOCIN BOLUS FROM INFUSION: 333.0000 mL | Freq: Once | INTRAVENOUS | Status: DC

## 2020-04-18 MED ORDER — LIDOCAINE HCL (PF) 1 % IJ SOLN: 30.0000 mL | INTRAMUSCULAR | Status: DC | PRN

## 2020-04-18 MED ORDER — ACETAMINOPHEN 325 MG PO TABS: 650.0000 mg | ORAL_TABLET | ORAL | Status: DC | PRN

## 2020-04-18 MED ORDER — MISOPROSTOL 50MCG HALF TABLET
ORAL_TABLET | ORAL | Status: AC
  Administered 2020-04-18: 50 ug
  Filled 2020-04-18: qty 1

## 2020-04-18 MED ORDER — TERBUTALINE SULFATE 1 MG/ML IJ SOLN
0.2500 mg | Freq: Once | INTRAMUSCULAR | Status: DC | PRN
  Filled 2020-04-18: qty 1

## 2020-04-18 MED ORDER — LACTATED RINGERS IV SOLN
500.0000 mL | INTRAVENOUS | Status: DC | PRN
  Administered 2020-04-19: 500 mL via INTRAVENOUS

## 2020-04-18 MED ORDER — FENTANYL CITRATE (PF) 100 MCG/2ML IJ SOLN
50.0000 ug | INTRAMUSCULAR | Status: DC | PRN
  Administered 2020-04-19: 100 ug via INTRAVENOUS

## 2020-04-18 MED ORDER — OXYCODONE-ACETAMINOPHEN 5-325 MG PO TABS: 2.0000 | ORAL_TABLET | ORAL | Status: DC | PRN

## 2020-04-18 MED ORDER — ONDANSETRON HCL 4 MG/2ML IJ SOLN
4.0000 mg | Freq: Four times a day (QID) | INTRAMUSCULAR | Status: DC | PRN
  Administered 2020-04-19: 4 mg via INTRAVENOUS

## 2020-04-18 MED ORDER — OXYTOCIN-SODIUM CHLORIDE 30-0.9 UT/500ML-% IV SOLN: 2.5000 [IU]/h | INTRAVENOUS | Status: DC

## 2020-04-18 NOTE — MAU Note (Signed)
.   Crystal Joseph is a 29 y.o. at [redacted]w[redacted]d here in MAU reporting: large gush of fluid around 7am. Denies any vaginal bleeding. Mild abdominal cramping Onset of complaint: 7am Pain score: 3 Vitals:   04/18/20 0810 04/18/20 0811  BP: 98/63   Pulse: 83   Resp: 16   Temp: 98.5 F (36.9 C)   SpO2:  99%     FHT:135 Lab orders placed from triage:

## 2020-04-18 NOTE — OB Triage Provider Note (Signed)
[id number] Crystal Joseph 11-10-1990   Nurse to provider and requests BSUS to confirm presentation.  Nurse also reports patient with desire for WB.  Review of patient chart shows patient with current GDM-A2 on Metformin.  Provider to bedside and confirms patient desire for WB.  Patient informed that she is not a candidate for waterbirth given GDM-A2 status and inability for intermittent monitoring.  Apologies given and patient without questions or concerns.  Patient informed that the ultrasound is considered a limited OB ultrasound and is not intended to be a complete ultrasound exam.  Patient also informed that the ultrasound is not being completed with the intent of assessing for fetal or placental anomalies or any pelvic abnormalities.  Explained that the purpose of today's ultrasound is to assess for  presentation.  Patient acknowledges the purpose of the exam and the limitations of the study.  Cephalic presentation noted.  Maryann Conners, CNM 04/18/2020, 8:52 AM

## 2020-04-18 NOTE — Progress Notes (Addendum)
Labor Progress Note Crystal Joseph is a 29 y.o. G1P0 at [redacted]w[redacted]d presented for PROM  S: Patient is feeling intermittent contractions but still comfortable and walking around. Continues to have some leakage of fluid, states it was clear now with scant blood and mucus.  O:  BP 117/79   Pulse 68   Temp 98 F (36.7 C) (Oral)   Resp 18   Ht 4\' 11"  (1.499 m)   Wt 60 kg   LMP 07/26/2019 (Exact Date)   SpO2 99%   BMI 26.70 kg/m  EFM: baseline 130/moderate variability/+accels, no decels  CVE: Dilation: 2.5 Effacement (%): 60 Cervical Position: Posterior Station: -3 Presentation: Vertex Exam by:: Ament, RN   A&P: 29 y.o. G1P0 [redacted]w[redacted]d presented for PROM #Labor: Has not made much cervical change since last check this morning. Will give buccal cytotec x1. Plan for recheck in 4h and can consider starting Pitocin at that time if appropriate.  #Pain: Per patient request #FWB: Cat I, reactive  #GBS negative #A2GDM: On Metformin which has been held on admission. CBG's q4h while in latent labor, q2h in active labor. Stable, last CBG 66.   Sharion Settler, DO 10:20 PM  Attestation of Supervision of Student:  I confirm that I have verified the information documented in the  resident's  note and that I have also personally reperformed the history, physical exam and all medical decision making activities.  I have verified that all services and findings are accurately documented in this student's note; and I agree with management and plan as outlined in the documentation. I have also made any necessary editorial changes.  Randa Ngo, [place] for [facility], [facility] 04/18/2020 11:43 PM

## 2020-04-18 NOTE — H&P (Signed)
OBSTETRIC ADMISSION HISTORY AND PHYSICAL  Derrick Orris is a 29 y.o. female G1P0 with IUP at 48w1dby LMP presenting for PROM. She reports +FMs, no VB, no blurry vision, headaches or peripheral edema, and RUQ pain.  She plans on breast feeding. She is undecided on birth control. She received her prenatal care at [facility]   Reports large gush of fluid at 7AM. Confirmed ruptured in MAU.   Dating: By LMP --->  Estimated Date of Delivery: 05/01/20  Sono:    _0 , CWD, normal anatomy, cephalic, anterior placenta, 2651g, 15% EFW   Prenatal History/Complications:  -AR4WNI- on metformin BID, overall CBG have been within appropriate range  -uterine fibroid, 4cm Left LUS    Past Medical History: Past Medical History:  Diagnosis Date  . Gestational diabetes   . Uterine fibroid     Past Surgical History: Past Surgical History:  Procedure Laterality Date  . NO PAST SURGERIES      Obstetrical History: OB History    Gravida  1   Para      Term      Preterm      AB      Living  0     SAB      TAB      Ectopic      Multiple      Live Births              Social History Social History   Socioeconomic History  . Marital status: Married    Spouse name: Not on file  . Number of children: Not on file  . Years of education: Not on file  . Highest education level: Not on file  Occupational History  . Not on file  Tobacco Use  . Smoking status: Never Smoker  . Smokeless tobacco: Never Used  Vaping Use  . Vaping Use: Never used  Substance and Sexual Activity  . Alcohol use: Never  . Drug use: Never  . Sexual activity: Yes    Birth control/protection: None  Other Topics Concern  . Not on file  Social History Narrative  . Not on file   Social Determinants of Health   Financial Resource Strain:   . Difficulty of Paying Living Expenses: Not on file  Food Insecurity:   . Worried About RCharity fundraiserin the Last Year: Not on file  . Ran Out of Food in the  Last Year: Not on file  Transportation Needs:   . Lack of Transportation (Medical): Not on file  . Lack of Transportation (Non-Medical): Not on file  Physical Activity:   . Days of Exercise per Week: Not on file  . Minutes of Exercise per Session: Not on file  Stress:   . Feeling of Stress : Not on file  Social Connections:   . Frequency of Communication with Friends and Family: Not on file  . Frequency of Social Gatherings with Friends and Family: Not on file  . Attends Religious Services: Not on file  . Active Member of Clubs or Organizations: Not on file  . Attends CArchivistMeetings: Not on file  . Marital Status: Not on file    Family History: History reviewed. No pertinent family history.  Allergies: No Known Allergies  Medications Prior to Admission  Medication Sig Dispense Refill Last Dose  . Accu-Chek Softclix Lancets lancets 1 each by Other route 4 (four) times daily. 100 each 12 04/18/2020 at Unknown time  . Blood Glucose  Monitoring Suppl (ACCU-CHEK NANO SMARTVIEW) w/Device KIT 1 kit by Subdermal route as directed. Check blood sugars for fasting, and two hours after breakfast, lunch and dinner (4 checks daily) 1 kit 0 04/18/2020 at Unknown time  . glucose blood (ACCU-CHEK SMARTVIEW) test strip Use as instructed to check blood sugars 100 each 12 04/18/2020 at Unknown time  . Lancets (FREESTYLE) lancets USE AS DIRECTED TO CHECK BLOOD GLUCOSE FOUR TIMES DAILY   04/18/2020 at Unknown time  . metFORMIN (GLUCOPHAGE) 500 MG tablet Take 1 tablet (500 mg total) by mouth 2 (two) times daily with a meal. (Patient taking differently: Take 500 mg by mouth 2 (two) times daily with a meal. Restarted 03-20-20 Bid) 60 tablet 2 04/17/2020 at Unknown time  . prenatal vitamin w/FE, FA (PRENATAL 1 + 1) 27-1 MG TABS tablet Take 1 tablet by mouth daily at 12 noon. 30 tablet 11 04/17/2020 at Unknown time     Review of Systems   All systems reviewed and negative except as stated in  HPI  Blood pressure 98/63, pulse 83, temperature 98.5 F (36.9 C), resp. rate 16, height _0  (1.499 m), weight 60 kg, last menstrual period 07/26/2019, SpO2 99 %. General appearance: alert, cooperative and appears stated age Lungs: clear to auscultation bilaterally Heart: regular rate and rhythm Abdomen: soft, non-tender; bowel sounds normal Extremities: Homans sign is negative, no sign of DVT  Presentation: cephalic confirmed w US  Fetal monitoring baseline 145, mod variability, pos accels, neg decels  Uterine activity q2-4 min  Dilation: 2 Effacement (%): 50 Station: -2 Exam by:: Truitt Leep, RNC   Prenatal labs: ABO, Rh: O/Positive/-- (02/25 1028) Antibody: Negative (02/25 1028) Rubella: 4.72 (02/25 1028) RPR: Non Reactive (07/09 0909)  HBsAg: Negative (02/25 1028)  HIV: Non Reactive (07/09 0909)  GBS: Negative/-- (08/31 1103)  2 hr Glucola failed  Genetic screening  normal Anatomy US normal   Prenatal Transfer Tool  Maternal Diabetes: Yes:  Diabetes Type:  Insulin/Medication controlled Genetic Screening: Normal Maternal Ultrasounds/Referrals: Normal Fetal Ultrasounds or other Referrals:  None Maternal Substance Abuse:  No Significant Maternal Medications:  None Significant Maternal Lab Results: Group B Strep negative  Results for orders placed or performed during the hospital encounter of 04/18/20 (from the past 24 hour(s))  POCT fern test   Collection Time: 04/18/20  8:21 AM  Result Value Ref Range   POCT Fern Test Positive = ruptured amniotic [place]     Patient Active Problem List   Diagnosis Date Noted  . Uterine fibroid in pregnancy 03/01/2020  . GDM (gestational diabetes mellitus) 02/20/2020  . Supervision of normal first pregnancy, antepartum 09/29/2019  . Hemorrhoids 10/08/2015  . Esophageal reflux 02/21/2013  . Anxiety state 01/08/2013    Assessment/Plan:  Akira Perusse is a 30 y.o. G1P0 at 79w1dhere for PROM, appears to be transitioning to early  labor  #PROM/Labor: PROM at 0700. Likely early labor, given no progression will start with dose of cytotec at this time. Anticipate SVD   #A2GDM: has been well controlled on metformin. Hold metformin. CBG q4hrs.  #Pain: Epidural prn. Had desired waterbirth but not candidate for it given A2gdm, has been discussed w patient.  #FWB: Cat I  #ID:  GBS negative. PROM 0700.  #MOF: breast #MOC:undecided   JJanet Berlin MD  04/18/2020, 9:01 AM

## 2020-04-19 ENCOUNTER — Encounter (HOSPITAL_COMMUNITY)
Admission: AD | Disposition: A | Discharge status: Home / Self Care | Payer: Self-pay | Source: Home / Self Care | Attending: Obstetrics & Gynecology

## 2020-04-19 ENCOUNTER — Encounter: Payer: 59 | Admitting: Obstetrics & Gynecology

## 2020-04-19 ENCOUNTER — Inpatient Hospital Stay (HOSPITAL_COMMUNITY): Payer: 59 | Admitting: Anesthesiology

## 2020-04-19 ENCOUNTER — Encounter (HOSPITAL_COMMUNITY): Payer: Self-pay | Admitting: Obstetrics and Gynecology

## 2020-04-19 DIAGNOSIS — O3413 Maternal care for benign tumor of corpus uteri, third trimester: Secondary | ICD-10-CM

## 2020-04-19 DIAGNOSIS — Z3A38 38 weeks gestation of pregnancy: Secondary | ICD-10-CM

## 2020-04-19 LAB — GLUCOSE, CAPILLARY
Glucose-Capillary: 76 mg/dL (ref 70–99)
Glucose-Capillary: 78 mg/dL (ref 70–99)
Glucose-Capillary: 84 mg/dL (ref 70–99)

## 2020-04-19 LAB — RPR: RPR Ser Ql: NONREACTIVE

## 2020-04-19 SURGERY — Surgical Case
Anesthesia: Epidural

## 2020-04-19 MED ORDER — MENTHOL 3 MG MT LOZG: 1.0000 | LOZENGE | OROMUCOSAL | Status: DC | PRN

## 2020-04-19 MED ORDER — EPHEDRINE 5 MG/ML INJ: 10.0000 mg | INTRAVENOUS | Status: DC | PRN

## 2020-04-19 MED ORDER — HYDROMORPHONE HCL 1 MG/ML IJ SOLN: 1.0000 mg | INTRAMUSCULAR | Status: DC | PRN

## 2020-04-19 MED ORDER — SCOPOLAMINE 1 MG/3DAYS TD PT72
MEDICATED_PATCH | TRANSDERMAL | Status: AC
  Filled 2020-04-19: qty 1

## 2020-04-19 MED ORDER — PHENYLEPHRINE 40 MCG/ML (10ML) SYRINGE FOR IV PUSH (FOR BLOOD PRESSURE SUPPORT): 80.0000 ug | PREFILLED_SYRINGE | INTRAVENOUS | Status: DC | PRN

## 2020-04-19 MED ORDER — DIBUCAINE (PERIANAL) 1 % EX OINT: 1.0000 "application " | TOPICAL_OINTMENT | CUTANEOUS | Status: DC | PRN

## 2020-04-19 MED ORDER — SIMETHICONE 80 MG PO CHEW
80.0000 mg | CHEWABLE_TABLET | ORAL | Status: DC | PRN
  Administered 2020-04-21: 80 mg via ORAL

## 2020-04-19 MED ORDER — NALBUPHINE HCL 10 MG/ML IJ SOLN: 5.0000 mg | Freq: Once | INTRAMUSCULAR | Status: DC | PRN

## 2020-04-19 MED ORDER — DEXAMETHASONE SODIUM PHOSPHATE 4 MG/ML IJ SOLN
INTRAMUSCULAR | Status: AC
  Filled 2020-04-19: qty 1

## 2020-04-19 MED ORDER — SODIUM CHLORIDE 0.9 % IR SOLN
Status: DC | PRN
  Administered 2020-04-19: 1

## 2020-04-19 MED ORDER — MEPERIDINE HCL 25 MG/ML IJ SOLN
6.2500 mg | INTRAMUSCULAR | Status: AC | PRN
  Administered 2020-04-19 (×2): 6.25 mg via INTRAVENOUS

## 2020-04-19 MED ORDER — SENNOSIDES-DOCUSATE SODIUM 8.6-50 MG PO TABS
2.0000 | ORAL_TABLET | ORAL | Status: DC
  Administered 2020-04-21: 2 via ORAL
  Filled 2020-04-19 (×2): qty 2

## 2020-04-19 MED ORDER — TRAMADOL HCL 50 MG PO TABS
50.0000 mg | ORAL_TABLET | Freq: Four times a day (QID) | ORAL | Status: DC | PRN
  Filled 2020-04-19: qty 1

## 2020-04-19 MED ORDER — FENTANYL-BUPIVACAINE-NACL 0.5-0.125-0.9 MG/250ML-% EP SOLN
12.0000 mL/h | EPIDURAL | Status: DC | PRN
  Filled 2020-04-19: qty 250

## 2020-04-19 MED ORDER — OXYTOCIN-SODIUM CHLORIDE 30-0.9 UT/500ML-% IV SOLN
1.0000 m[IU]/min | INTRAVENOUS | Status: DC
  Administered 2020-04-19: 1 m[IU]/min via INTRAVENOUS
  Administered 2020-04-19: 30 [IU] via INTRAVENOUS
  Filled 2020-04-19: qty 500

## 2020-04-19 MED ORDER — FENTANYL CITRATE (PF) 100 MCG/2ML IJ SOLN: 25.0000 ug | INTRAMUSCULAR | Status: DC | PRN

## 2020-04-19 MED ORDER — MEASLES, MUMPS & RUBELLA VAC IJ SOLR: 0.5000 mL | Freq: Once | INTRAMUSCULAR | Status: DC

## 2020-04-19 MED ORDER — FENTANYL CITRATE (PF) 100 MCG/2ML IJ SOLN
INTRAMUSCULAR | Status: AC
  Filled 2020-04-19: qty 2

## 2020-04-19 MED ORDER — CEFAZOLIN SODIUM-DEXTROSE 2-4 GM/100ML-% IV SOLN
INTRAVENOUS | Status: AC
  Filled 2020-04-19: qty 100

## 2020-04-19 MED ORDER — LACTATED RINGERS IV SOLN: INTRAVENOUS | Status: DC

## 2020-04-19 MED ORDER — OXYCODONE-ACETAMINOPHEN 5-325 MG PO TABS: 2.0000 | ORAL_TABLET | ORAL | Status: DC | PRN

## 2020-04-19 MED ORDER — MEPERIDINE HCL 25 MG/ML IJ SOLN
INTRAMUSCULAR | Status: DC | PRN
Start: 2020-04-19 — End: 2020-04-19
  Administered 2020-04-19: 12.5 mg via INTRAVENOUS

## 2020-04-19 MED ORDER — SODIUM CHLORIDE (PF) 0.9 % IJ SOLN
INTRAMUSCULAR | Status: DC | PRN
Start: 2020-04-19 — End: 2020-04-19
  Administered 2020-04-19: 12 mL/h via EPIDURAL

## 2020-04-19 MED ORDER — PHENYLEPHRINE HCL (PRESSORS) 10 MG/ML IV SOLN
INTRAVENOUS | Status: DC | PRN
  Administered 2020-04-19: 80 ug via INTRAVENOUS

## 2020-04-19 MED ORDER — PHENYLEPHRINE 40 MCG/ML (10ML) SYRINGE FOR IV PUSH (FOR BLOOD PRESSURE SUPPORT)
PREFILLED_SYRINGE | INTRAVENOUS | Status: AC
  Filled 2020-04-19: qty 10

## 2020-04-19 MED ORDER — NALOXONE HCL 0.4 MG/ML IJ SOLN: 0.4000 mg | INTRAMUSCULAR | Status: DC | PRN

## 2020-04-19 MED ORDER — OXYTOCIN-SODIUM CHLORIDE 30-0.9 UT/500ML-% IV SOLN
INTRAVENOUS | Status: AC
  Filled 2020-04-19: qty 500

## 2020-04-19 MED ORDER — INFLUENZA VAC SPLIT QUAD 0.5 ML IM SUSY: 0.5000 mL | PREFILLED_SYRINGE | INTRAMUSCULAR | Status: DC

## 2020-04-19 MED ORDER — SIMETHICONE 80 MG PO CHEW
80.0000 mg | CHEWABLE_TABLET | ORAL | Status: DC
  Administered 2020-04-20: 80 mg via ORAL
  Filled 2020-04-19 (×2): qty 1

## 2020-04-19 MED ORDER — SODIUM CHLORIDE 0.9% FLUSH: 3.0000 mL | INTRAVENOUS | Status: DC | PRN

## 2020-04-19 MED ORDER — NALBUPHINE HCL 10 MG/ML IJ SOLN: 5.0000 mg | INTRAMUSCULAR | Status: DC | PRN

## 2020-04-19 MED ORDER — LACTATED RINGERS IV SOLN: 500.0000 mL | Freq: Once | INTRAVENOUS | Status: DC

## 2020-04-19 MED ORDER — TERBUTALINE SULFATE 1 MG/ML IJ SOLN: 0.2500 mg | Freq: Once | INTRAMUSCULAR | Status: DC | PRN

## 2020-04-19 MED ORDER — MORPHINE SULFATE (PF) 0.5 MG/ML IJ SOLN
INTRAMUSCULAR | Status: DC | PRN
Start: 2020-04-19 — End: 2020-04-19
  Administered 2020-04-19: 3 mg via EPIDURAL

## 2020-04-19 MED ORDER — DIPHENHYDRAMINE HCL 25 MG PO CAPS: 25.0000 mg | ORAL_CAPSULE | ORAL | Status: DC | PRN

## 2020-04-19 MED ORDER — KETOROLAC TROMETHAMINE 30 MG/ML IJ SOLN
30.0000 mg | Freq: Four times a day (QID) | INTRAMUSCULAR | Status: AC | PRN
  Administered 2020-04-19: 30 mg via INTRAMUSCULAR

## 2020-04-19 MED ORDER — MAGNESIUM HYDROXIDE 400 MG/5ML PO SUSP: 30.0000 mL | ORAL | Status: DC | PRN

## 2020-04-19 MED ORDER — DEXAMETHASONE SODIUM PHOSPHATE 4 MG/ML IJ SOLN
INTRAMUSCULAR | Status: DC | PRN
  Administered 2020-04-19: 4 mg via INTRAVENOUS

## 2020-04-19 MED ORDER — LIDOCAINE-EPINEPHRINE (PF) 2 %-1:200000 IJ SOLN
INTRAMUSCULAR | Status: DC | PRN
  Administered 2020-04-19 (×2): 5 mL via EPIDURAL

## 2020-04-19 MED ORDER — OXYTOCIN-SODIUM CHLORIDE 30-0.9 UT/500ML-% IV SOLN: 2.5000 [IU]/h | INTRAVENOUS | Status: AC

## 2020-04-19 MED ORDER — KETOROLAC TROMETHAMINE 30 MG/ML IJ SOLN
INTRAMUSCULAR | Status: AC
  Filled 2020-04-19: qty 1

## 2020-04-19 MED ORDER — PRENATAL MULTIVITAMIN CH
1.0000 | ORAL_TABLET | Freq: Every day | ORAL | Status: DC
  Administered 2020-04-20 – 2020-04-21 (×2): 1 via ORAL
  Filled 2020-04-19 (×2): qty 1

## 2020-04-19 MED ORDER — SODIUM CHLORIDE 0.9 % IV SOLN
INTRAVENOUS | Status: AC
  Filled 2020-04-19: qty 500

## 2020-04-19 MED ORDER — SODIUM CHLORIDE 0.9 % IV SOLN: INTRAVENOUS | Status: DC | PRN

## 2020-04-19 MED ORDER — OXYCODONE HCL 5 MG PO TABS: 5.0000 mg | ORAL_TABLET | ORAL | Status: DC | PRN

## 2020-04-19 MED ORDER — GABAPENTIN 100 MG PO CAPS
300.0000 mg | ORAL_CAPSULE | Freq: Two times a day (BID) | ORAL | Status: DC
  Administered 2020-04-20 – 2020-04-21 (×3): 300 mg via ORAL
  Filled 2020-04-19 (×3): qty 3

## 2020-04-19 MED ORDER — SCOPOLAMINE 1 MG/3DAYS TD PT72
MEDICATED_PATCH | TRANSDERMAL | Status: DC | PRN
  Administered 2020-04-19: 1 via TRANSDERMAL

## 2020-04-19 MED ORDER — ONDANSETRON HCL 4 MG/2ML IJ SOLN: 4.0000 mg | Freq: Three times a day (TID) | INTRAMUSCULAR | Status: DC | PRN

## 2020-04-19 MED ORDER — TETANUS-DIPHTH-ACELL PERTUSSIS 5-2.5-18.5 LF-MCG/0.5 IM SUSP: 0.5000 mL | Freq: Once | INTRAMUSCULAR | Status: DC

## 2020-04-19 MED ORDER — LACTATED RINGERS AMNIOINFUSION: INTRAVENOUS | Status: DC

## 2020-04-19 MED ORDER — DIPHENHYDRAMINE HCL 25 MG PO CAPS: 25.0000 mg | ORAL_CAPSULE | Freq: Four times a day (QID) | ORAL | Status: DC | PRN

## 2020-04-19 MED ORDER — KETOROLAC TROMETHAMINE 30 MG/ML IJ SOLN
30.0000 mg | Freq: Four times a day (QID) | INTRAMUSCULAR | Status: AC
  Filled 2020-04-19: qty 1

## 2020-04-19 MED ORDER — NALOXONE HCL 4 MG/10ML IJ SOLN
1.0000 ug/kg/h | INTRAVENOUS | Status: DC | PRN
  Filled 2020-04-19: qty 5

## 2020-04-19 MED ORDER — SODIUM CHLORIDE (PF) 0.9 % IJ SOLN
INTRAMUSCULAR | Status: AC
  Filled 2020-04-19: qty 10

## 2020-04-19 MED ORDER — COCONUT OIL OIL: 1.0000 "application " | TOPICAL_OIL | Status: DC | PRN

## 2020-04-19 MED ORDER — MORPHINE SULFATE (PF) 0.5 MG/ML IJ SOLN
INTRAMUSCULAR | Status: AC
  Filled 2020-04-19: qty 10

## 2020-04-19 MED ORDER — KETOROLAC TROMETHAMINE 30 MG/ML IJ SOLN: 30.0000 mg | Freq: Once | INTRAMUSCULAR | Status: DC

## 2020-04-19 MED ORDER — FERROUS SULFATE 325 (65 FE) MG PO TABS
325.0000 mg | ORAL_TABLET | Freq: Two times a day (BID) | ORAL | Status: DC
  Administered 2020-04-19 – 2020-04-21 (×4): 325 mg via ORAL
  Filled 2020-04-19 (×4): qty 1

## 2020-04-19 MED ORDER — DIPHENHYDRAMINE HCL 50 MG/ML IJ SOLN: 12.5000 mg | INTRAMUSCULAR | Status: DC | PRN

## 2020-04-19 MED ORDER — LIDOCAINE HCL (PF) 1 % IJ SOLN
INTRAMUSCULAR | Status: DC | PRN
  Administered 2020-04-19: 5 mL via EPIDURAL

## 2020-04-19 MED ORDER — MEPERIDINE HCL 25 MG/ML IJ SOLN
INTRAMUSCULAR | Status: AC
  Filled 2020-04-19: qty 1

## 2020-04-19 MED ORDER — CEFAZOLIN SODIUM-DEXTROSE 2-3 GM-%(50ML) IV SOLR
INTRAVENOUS | Status: DC | PRN
  Administered 2020-04-19: 2 g via INTRAVENOUS

## 2020-04-19 MED ORDER — KETOROLAC TROMETHAMINE 30 MG/ML IJ SOLN: 30.0000 mg | Freq: Four times a day (QID) | INTRAMUSCULAR | Status: AC | PRN

## 2020-04-19 MED ORDER — ZOLPIDEM TARTRATE 5 MG PO TABS: 5.0000 mg | ORAL_TABLET | Freq: Every evening | ORAL | Status: DC | PRN

## 2020-04-19 MED ORDER — ONDANSETRON HCL 4 MG/2ML IJ SOLN
INTRAMUSCULAR | Status: AC
  Filled 2020-04-19: qty 2

## 2020-04-19 MED ORDER — ENOXAPARIN SODIUM 40 MG/0.4ML ~~LOC~~ SOLN
40.0000 mg | SUBCUTANEOUS | Status: DC
  Filled 2020-04-19 (×2): qty 0.4

## 2020-04-19 MED ORDER — WITCH HAZEL-GLYCERIN EX PADS: 1.0000 "application " | MEDICATED_PAD | CUTANEOUS | Status: DC | PRN

## 2020-04-19 MED ORDER — IBUPROFEN 800 MG PO TABS
800.0000 mg | ORAL_TABLET | Freq: Four times a day (QID) | ORAL | Status: DC
  Administered 2020-04-20 – 2020-04-21 (×4): 800 mg via ORAL
  Filled 2020-04-19 (×5): qty 1

## 2020-04-19 MED ORDER — SODIUM CHLORIDE 0.9 % IV SOLN
INTRAVENOUS | Status: DC | PRN
  Administered 2020-04-19: 250 mg via INTRAVENOUS

## 2020-04-19 SURGICAL SUPPLY — 30 items
CHLORAPREP W/TINT 26ML (MISCELLANEOUS) ×2 IMPLANT
CLAMP CORD UMBIL (MISCELLANEOUS) IMPLANT
CLOTH BEACON ORANGE TIMEOUT ST (SAFETY) ×2 IMPLANT
DRSG OPSITE POSTOP 4X10 (GAUZE/BANDAGES/DRESSINGS) ×2 IMPLANT
ELECT REM PT RETURN 9FT ADLT (ELECTROSURGICAL) ×2
ELECTRODE REM PT RTRN 9FT ADLT (ELECTROSURGICAL) ×1 IMPLANT
EXTRACTOR VACUUM M CUP 4 TUBE (SUCTIONS) IMPLANT
GLOVE BIOGEL PI IND STRL 7.0 (GLOVE) ×3 IMPLANT
GLOVE BIOGEL PI INDICATOR 7.0 (GLOVE) ×3
GLOVE ECLIPSE 7.0 STRL STRAW (GLOVE) ×2 IMPLANT
GOWN STRL REUS W/TWL LRG LVL3 (GOWN DISPOSABLE) ×4 IMPLANT
KIT ABG SYR 3ML LUER SLIP (SYRINGE) IMPLANT
NEEDLE HYPO 22GX1.5 SAFETY (NEEDLE) ×2 IMPLANT
NEEDLE HYPO 25X5/8 SAFETYGLIDE (NEEDLE) ×2 IMPLANT
NS IRRIG 1000ML POUR BTL (IV SOLUTION) ×2 IMPLANT
PACK C SECTION WH (CUSTOM PROCEDURE TRAY) ×2 IMPLANT
PAD ABD 7.5X8 STRL (GAUZE/BANDAGES/DRESSINGS) ×2 IMPLANT
PAD OB MATERNITY 4.3X12.25 (PERSONAL CARE ITEMS) ×2 IMPLANT
PENCIL SMOKE EVAC W/HOLSTER (ELECTROSURGICAL) ×2 IMPLANT
RTRCTR C-SECT PINK 25CM LRG (MISCELLANEOUS) IMPLANT
SPONGE GAUZE 4X4 12PLY STER LF (GAUZE/BANDAGES/DRESSINGS) ×2 IMPLANT
SUT PDS AB 0 CTX 36 PDP370T (SUTURE) IMPLANT
SUT PLAIN 2 0 XLH (SUTURE) IMPLANT
SUT VIC AB 0 CTX 36 (SUTURE) ×2
SUT VIC AB 0 CTX36XBRD ANBCTRL (SUTURE) ×2 IMPLANT
SUT VIC AB 4-0 KS 27 (SUTURE) ×2 IMPLANT
SYR CONTROL 10ML LL (SYRINGE) ×2 IMPLANT
TOWEL OR 17X24 6PK STRL BLUE (TOWEL DISPOSABLE) ×2 IMPLANT
TRAY FOLEY W/BAG SLVR 14FR LF (SET/KITS/TRAYS/PACK) ×2 IMPLANT
WATER STERILE IRR 1000ML POUR (IV SOLUTION) ×2 IMPLANT

## 2020-04-19 NOTE — Transfer of Care (Signed)
Immediate Anesthesia Transfer of Care Note  Patient: Crystal Joseph  Procedure(s) Performed: CESAREAN SECTION (N/A )  Patient Location: PACU  Anesthesia Type:Epidural  Level of Consciousness: awake, alert  and oriented  Airway & Oxygen Therapy: Patient Spontanous Breathing  Post-op Assessment: Report given to RN and Post -op Vital signs reviewed and stable  Post vital signs: Reviewed and stable  Last Vitals:  Vitals Value Taken Time  BP 90/60 04/19/20 1100  Temp 35.8 C 04/19/20 1030  Pulse 81 04/19/20 1113  Resp 18 04/19/20 1113  SpO2 99 % 04/19/20 1113  Vitals shown include unvalidated device data.  Last Pain:  Vitals:   04/19/20 1045  TempSrc:   PainSc: 0-No pain         Complications: No complications documented.

## 2020-04-19 NOTE — Op Note (Addendum)
Crystal Joseph PROCEDURE DATE: 04/19/2020  PREOPERATIVE DIAGNOSES: Intrauterine pregnancy at [redacted]w[redacted]d weeks gestation; non-reassuring fetal status with prolonged fetal heart rate deceleration not responsive to intrauterine resuscitation efforts   POSTOPERATIVE DIAGNOSES: The same  PROCEDURE: Primary Low Transverse Cesarean Section  SURGEON:  Dr. Verita Schneiders  ANESTHESIOLOGY TEAM: Anesthesiologist: Josephine Igo, MD; Barnet Glasgow, MD CRNA: Genevie Ann, CRNA Student Nurse Anesthetist: Glenis Smoker, RN  INDICATIONS: Crystal Joseph is a 29 y.o. G1P0 at [redacted]w[redacted]d here for urgent cesarean section due to prolonged deceleration not responsive to intrauterine resuscitation efforts.  A CODE CESAREAN was activated.  Due to emergency situation, a full written consent process was not obtained. The risks, benefits, complications, treatment options, and expected outcomes were discussed with the patient while moving to the OR.   The patient concurred with the proposed plan, giving verbal informed consent.      FINDINGS:  Viable female infant in cephalic presentation. Tight nuchal cord x 1.  Apgars 8 and 9. Arterial cord pH 7.25.  Clear amniotic fluid.  Intact placenta, three vessel cord.  About 4 cm exophytic fibroid noted in posterior left lower uterine segment, also had 2 cm subserosal one in right anterior lower uterine segment.  Normal uterus, fallopian tubes and ovaries bilaterally.  ANESTHESIA: Epidural  INTRAVENOUS FLUIDS: 900 ml   ESTIMATED BLOOD LOSS: 338 ml URINE OUTPUT:  500 ml SPECIMENS: Placenta sent to L&D COMPLICATIONS: None immediate  PROCEDURE IN DETAIL:  The patient was urgently taken to the the operating room her epidural anesthesia was dosed up to surgical level and was found to be adequate. She was then placed in a dorsal supine position with a leftward tilt, prepped quickly with betadine and draped in a sterile manner.  She already had a foley catheter in her bladder from L&D.   After a timeout was performed, a Pfannenstiel skin incision was made with scalpel and carried through to the underlying layer of fascia. The fascial incision was extended bilaterally in a blunt fashion.  The fascia was separated from underlying rectus muscles bluntly.  The rectus muscles were separated in the midline bluntly and the peritoneum was entered bluntly. Attention was turned to the lower uterine segment where a low transverse hysterotomy was made with a scalpel and extended bilaterally bluntly.  The infant was successfully delivered, the cord was clamped and cut and the infant was handed over to awaiting neonatology team. Incision to delivery time was about one minute.  The placenta was delivered intact and had a three-vessel cord.  The uterus was then cleared of clots and debris.  The hysterotomy was closed with 0 Vicryl in a running locked fashion, and an imbricating layer was also placed with 0 Vicryl.  Figure-of-eight 0 Vicryl serosal stitches were placed to help with hemostasis.  The pelvis was cleared of all clot and debris. Hemostasis was confirmed on all surfaces.  The retractor was removed.  The peritoneum was closed with a 0 Vicryl running stitch. The fascia was then closed using 0 Vicryl in a running fashion.  The subcutaneous layer was irrigated, reapproximated with 2-0 plain gut interrupted stitches, and the skin was closed with a 4-0 Vicryl subcuticular stitch. The patient tolerated the procedure well. Sponge, instrument and needle counts were correct x 3.  She was taken to the recovery room in stable condition.     Verita Schneiders, MD, [place] for [employer], [place]

## 2020-04-19 NOTE — Progress Notes (Signed)
Called to bedside for prolonged FHR deceleration. S/p amnioinfusion bolus for previous variable decels. Pitocin off. Dr. Harolyn Rutherford at bedside. SVE 7/90/-1. Pt in H&K then turned left lateral and Terbutaline given. FHR persists in the 60s. Code Cesarean called. To OR.

## 2020-04-19 NOTE — Progress Notes (Signed)
Labor Progress Note Crystal Joseph is a 29 y.o. G1P0 at [redacted]w[redacted]d presented for PROM  S: Strip note. Per nursing, pt more comfortable s/p placement of epidural.  O:  BP 114/70   Pulse 64   Temp 98 F (36.7 C) (Oral)   Resp 20   Ht 4\' 11"  (1.499 m)   Wt 60 kg   LMP 07/26/2019 (Exact Date)   SpO2 99%   BMI 26.70 kg/m  EFM: baseline 130/moderate variability/+accels, no decels toco q2-4 min  CVE: Dilation: 3 Effacement (%): 60 Cervical Position: Posterior Station: -1 Presentation: Vertex Exam by:: B McClam, RN    A&P: 29 y.o. G1P0 [redacted]w[redacted]d presented for PROM #Labor: S/p buccal cytotec x1 @2226 . Epidural now in place. Will start pitocin given recent cervical exam. Will plan to recheck in 4 hours or sooner as clinically indicated. #Pain: epidural in place #FWB: Cat I, reactive  #GBS negative #A2GDM: On Metformin which has been held on admission. CBG's q4h while in latent labor, q2h in active labor. Stable, last CBG 66 >78 >84.  Randa Ngo, MD 4:34 AM

## 2020-04-19 NOTE — Progress Notes (Signed)
[name] was referred for history of depression/anxiety. * Referral screened out by Clinical Social Worker because none of the following criteria appear to apply: ~ History of anxiety/depression during this pregnancy, or of post-partum depression following prior delivery. ~ Diagnosis of anxiety and/or depression within last 3 years OR * [name]'s symptoms currently being treated with medication and/or therapy. Please contact the Clinical Social Worker if needs arise, by [name] request, or if [name] scores greater than 9/yes to question 10 on Edinburgh Postpartum Depression Screen.  Dorrie Cocuzza Boyd-Gilyard, MSW, LCSW Clinical Social Work [phone]  

## 2020-04-19 NOTE — Anesthesia Postprocedure Evaluation (Signed)
Anesthesia Post Note  Patient: Crystal Joseph  Procedure(s) Performed: CESAREAN SECTION (N/A )     Patient location during evaluation: PACU Anesthesia Type: Epidural Level of consciousness: oriented and awake and alert Pain management: pain level controlled Vital Signs Assessment: post-procedure vital signs reviewed and stable Respiratory status: spontaneous breathing, respiratory function stable and nonlabored ventilation Cardiovascular status: blood pressure returned to baseline and stable Postop Assessment: no headache, no backache, no apparent nausea or vomiting, epidural receding and patient able to bend at knees Anesthetic complications: no   No complications documented.  Last Vitals:  Vitals:   04/19/20 1115 04/19/20 1145  BP: 98/65 95/81  Pulse: 85 67  Resp: 18 10  Temp:    SpO2: 96% 97%    Last Pain:  Vitals:   04/19/20 1115  TempSrc:   PainSc: 0-No pain   Pain Goal:    LLE Motor Response: Purposeful movement (04/19/20 1145)   RLE Motor Response: Purposeful movement (04/19/20 1145)          [name]

## 2020-04-19 NOTE — Anesthesia Preprocedure Evaluation (Addendum)
Anesthesia Evaluation  Patient identified by MRN, date of birth, ID band Patient awake    Reviewed: Allergy & Precautions, NPO status , Patient's Chart, lab work & pertinent test results  Airway Mallampati: II  TM Distance: >3 FB Neck ROM: Full    Dental no notable dental hx. (+) Teeth Intact   Pulmonary neg pulmonary ROS,    Pulmonary exam normal breath sounds clear to auscultation       Cardiovascular Exercise Tolerance: Good negative cardio ROS Normal cardiovascular exam Rhythm:Regular Rate:Normal     Neuro/Psych negative neurological ROS     GI/Hepatic Neg liver ROS, GERD  Medicated and Controlled,  Endo/Other  diabetes, Gestational  Renal/GU negative Renal ROS     Musculoskeletal negative musculoskeletal ROS (+)   Abdominal   Peds  Hematology Hgb 12.5 Plt 273   Anesthesia Other Findings   Reproductive/Obstetrics (+) Pregnancy                            Anesthesia Physical Anesthesia Plan  ASA: III and emergent  Anesthesia Plan: Epidural   Post-op Pain Management:    Induction:   PONV Risk Score and Plan:   Airway Management Planned: Natural Airway  Additional Equipment:   Intra-op Plan:   Post-operative Plan:   Informed Consent: I have reviewed the patients History and Physical, chart, labs and discussed the procedure including the risks, benefits and alternatives for the proposed anesthesia with the patient or authorized representative who has indicated his/her understanding and acceptance.     Dental advisory given  Plan Discussed with: CRNA and Anesthesiologist  Anesthesia Plan Comments: (38.2 wk G1P0 for LEA  Code Cesarean for fetal distress. Will use epidural for C/Section. M. Royce Macadamia, MD)       Anesthesia Quick Evaluation

## 2020-04-19 NOTE — Lactation Note (Signed)
This note was copied from a baby's chart. Lactation Consultation Note  Patient Name: Crystal Joseph [id number] Date: 04/19/2020 Reason for consult: Initial assessment;Primapara;1st time breastfeeding;Early term 37-38.6wks;Infant < 6lbs 79 8hrs old, mom sitting in bed, maternal grandmother at bedside holding alert baby. Mom requests help with latching baby, states last feeding ~3p, states baby has already received DBM. Mom with inverted nipples bilat, taught hand expression, mom expressed 1 drop of colostrum bilat, LC fed back to baby with gloved finger. Hand pump used, temporarily everts nipple, baby up to breasts bilat in football and modified laid back, baby unable to sustain latch, loses after 3-4 sucks, no swallows noted. Encouraged mom to hold baby skin to skin, baby fed 28ml DBM with curved tipped syringe, fitted for 48mm nipple shield, baby latched without difficulty, fed ~74ml through nipple shield, nursed ~31mins. Mom held baby skin to skin, discussed use of DEBP, reviewed setup, cleanup, frequency, and milk storage. Reviewed cue based feeding, expect 8-12 in 24hrs, wake if >3hrs since last feeding, hand express after pumping and offer colostrum back to baby, reinforced pumping for stimulation and ok if milk not seen at this time, skin to skin, encouraged to delay bath for 24hrs, encouraged to keep feedings to 68mins at this time. Mom will notify LC/RN prior to discharge regarding obtaining employee pump. Mom sleepy throughout [place] visit, Crystal Pulling, RN entered to provide care, advised of plan and asked to reinforce with mom upon waking. Left the room with mom holding baby skin to skin talking to RN. BGilliam, RN, IBCLC  Maternal Data Formula Feeding for Exclusion: No Has patient been taught Hand Expression?: Yes Does the patient have breastfeeding experience prior to this delivery?: No  Feeding Feeding Type: Breast Fed  LATCH Score Latch: Grasps breast easily, tongue down, lips flanged, rhythmical  sucking.  Audible Swallowing: None  Type of Nipple: Inverted  Comfort (Breast/Nipple): Soft / non-tender  Hold (Positioning): Assistance needed to correctly position infant at breast and maintain latch.  LATCH Score: 5  Interventions Interventions: Breast feeding basics reviewed;Assisted with latch;Skin to skin;Breast massage;Hand express;Pre-pump if needed;Breast compression;Adjust position;Support pillows;Position options;Expressed milk;Hand pump;DEBP  Lactation Tools Discussed/Used Tools: Nipple Shields (59mm) WIC Program: No Pump Review: Setup, frequency, and cleaning;Milk Storage Initiated by:: Celene Kras, RN, IBCLC Date initiated:: 04/19/20   Consult Status Consult Status: Follow-up Date: 04/20/20 Follow-up type: In-patient    Crystal Joseph 04/19/2020, 6:02 PM

## 2020-04-19 NOTE — Anesthesia Procedure Notes (Signed)
Epidural Patient location during procedure: OB Start time: 04/19/2020 3:30 AM End time: 04/19/2020 3:43 AM  Staffing Anesthesiologist: Barnet Glasgow, MD Performed: anesthesiologist   Preanesthetic Checklist Completed: patient identified, IV checked, site marked, risks and benefits discussed, surgical consent, monitors and equipment checked, pre-op evaluation and timeout performed  Epidural Patient position: sitting Prep: DuraPrep and site prepped and draped Patient monitoring: continuous pulse ox and blood pressure Approach: midline Location: L3-L4 Injection technique: LOR air  Needle:  Needle type: Tuohy  Needle gauge: 17 G Needle length: 9 cm and 9 Needle insertion depth: 4 cm Catheter type: closed end flexible Catheter size: 19 Gauge Catheter at skin depth: 10 cm Test dose: negative  Assessment Events: blood not aspirated, injection not painful, no injection resistance, no paresthesia and negative IV test  Additional Notes Patient identified. Risks/Benefits/Options discussed with patient including but not limited to bleeding, infection, nerve damage, paralysis, failed block, incomplete pain control, headache, blood pressure changes, nausea, vomiting, reactions to medication both or allergic, itching and postpartum back pain. Confirmed with bedside nurse the patient's most recent platelet count. Confirmed with patient that they are not currently taking any anticoagulation, have any bleeding history or any family history of bleeding disorders. Patient expressed understanding and wished to proceed. All questions were answered. Sterile technique was used throughout the entire procedure. Please see nursing notes for vital signs. Test dose was given through epidural needle and negative prior to continuing to dose epidural or start infusion. Warning signs of high block given to the patient including shortness of breath, tingling/numbness in hands, complete motor block, or any  concerning symptoms with instructions to call for help. Patient was given instructions on fall risk and not to get out of bed. All questions and concerns addressed with instructions to call with any issues. 1 Attempt (S) . Patient tolerated procedure well.

## 2020-04-19 NOTE — Discharge Summary (Addendum)
Postpartum Discharge Summary    Patient Name: Crystal Joseph DOB: 03-18-1991 MRN: [id number]  Date of admission: 04/18/2020 Delivery date:04/19/2020  Delivering provider: Verita Schneiders A  Date of discharge: 04/21/2020  Admitting diagnosis: PROM (premature rupture of membranes) [O42.90] Intrauterine pregnancy: [redacted]w[redacted]d    Secondary diagnosis:  Principal Problem:   Postpartum care following cesarean delivery Active Problems:   Supervision of high-risk pregnancy   GDM (gestational diabetes mellitus)   Uterine fibroid in pregnancy   PROM (premature rupture of membranes)  Additional problems: None     Discharge diagnosis: Term Pregnancy Delivered                                              Post partum procedures: None Augmentation: Pitocin and Cytotec Complications: RLXB>26hours  Hospital course: Onset of Labor With Unplanned C/S   29y.o. yo G1P0 at 361w2das admitted in Latent Labor on 04/18/2020. She initially started induction of labor due to A2GDM. She started to experience late and variable decelerations around 4cm despite position changes, discontinuation of pit, and amnioinfusion. The patient then went for cesarean section due to Non-Reassuring FHR with prolonged deceleration. Delivery details as follows: Membrane Rupture Time/Date: 7:05 AM ,04/18/2020   Delivery Method:C-Section, Low Transverse    Details of operation can be found in separate operative note. Patient had an uncomplicated postpartum course. She had A2GDM during pregnancy, however postpartum glucose was normal. Additionally with history of depression/anxiety not on medication and edinburgh of 10 (without SI/HI), she was evaluated and counseled by SW during stay.  She is ambulating,tolerating a regular diet, passing flatus, and urinating well.  Patient is discharged home in stable condition 04/21/20.  Newborn Data: Birth date:04/19/2020  Birth time:9:50 AM  Gender:Female  Living status:Living  Apgars:8 ,9  Weight:2425  g   Magnesium Sulfate received: No BMZ received: No Rhophylac:No MMR:No T-DaP:Given prenatally Flu: No Transfusion:No  Physical exam  Vitals:   04/20/20 0523 04/20/20 2108 04/21/20 0119 04/21/20 0539  BP: 94/69 109/70  105/67  Pulse: 73 75  (!) 58  Resp: _0 Temp: 98.6 F (37 C) 98.7 F (37.1 C) 97.9 F (36.6 C) 97.9 F (36.6 C)  TempSrc: Oral Oral Oral Oral  SpO2: 98% 100%  100%  Weight:      Height:       General: alert, cooperative and no distress Lochia: appropriate Uterine Fundus: firm Incision: honeycomb dressing dry, clean, and intact  DVT Evaluation: No significant calf/ankle edema. Labs: Lab Results  Component Value Date   WBC 15.9 (H) 04/20/2020   HGB 11.0 (L) 04/20/2020   HCT 34.0 (L) 04/20/2020   MCV 91.2 04/20/2020   PLT 226 04/20/2020   CMP Latest Ref Rng & Units 04/20/2020  Creatinine 0.44 - 1.00 mg/dL 0.60   Edinburgh Score: Edinburgh Postnatal Depression Scale Screening Tool 04/21/2020  I have been able to laugh and see the funny side of things. 0  I have looked forward with enjoyment to things. 0  I have blamed myself unnecessarily when things went wrong. 2  I have been anxious or worried for no good reason. 2  I have felt scared or panicky for no good reason. 2  Things have been getting on top of me. 1  I have been so unhappy that I have had difficulty sleeping. 1  I  have felt sad or miserable. 1  I have been so unhappy that I have been crying. 1  The thought of harming myself has occurred to me. 0  Edinburgh Postnatal Depression Scale Total 10     After visit meds:  Allergies as of 04/21/2020   No Known Allergies      Medication List     STOP taking these medications    Accu-Chek Nano SmartView w/Device Kit   Accu-Chek SmartView test strip Generic drug: glucose blood   Accu-Chek Softclix Lancets lancets   freestyle lancets   metFORMIN 500 MG tablet Commonly known as: Glucophage       TAKE these medications     acetaminophen 500 MG tablet Commonly known as: TYLENOL Take 2 tablets (1,000 mg total) by mouth every 8 (eight) hours.   coconut oil Oil Apply 1 application topically as needed (nipple pain).   ibuprofen 800 MG tablet Commonly known as: ADVIL Take 1 tablet (800 mg total) by mouth every 8 (eight) hours.   oxyCODONE 5 MG immediate release tablet Commonly known as: Oxy IR/ROXICODONE Take 1 tablet (5 mg total) by mouth every 6 (six) hours as needed for severe pain or breakthrough pain.   prenatal vitamin w/FE, FA 27-1 MG Tabs tablet Take 1 tablet by mouth daily at 12 noon.               Discharge Care Instructions  (From admission, onward)           Start     Ordered   04/21/20 0000  Leave dressing on - Keep it clean, dry, and intact until clinic visit        04/21/20 1339             Discharge home in stable condition Infant Feeding: Breast Infant Disposition:home with mother Discharge instruction: per After Visit Summary and Postpartum booklet. Activity: Advance as tolerated. Pelvic rest for 6 weeks.  Diet: routine diet Future Appointments: Future Appointments  Date Time Provider [place]  05/16/2020  2:45 PM Truett Mainland, DO [facility] None  07/18/2020  8:30 AM Cirigliano, Garvin Fila, DO [id number]   Follow up Visit:    Please schedule this patient for a In person postpartum visit in 6 weeks with the following provider: Any provider. Additional Postpartum F/U:Incision check 1 week  High risk pregnancy complicated by: GDM, will need 2 hour glucola  Delivery mode:  C-Section, Low Transverse  Anticipated Birth Control:  Unsure   04/21/2020 Patriciaann Clan, DO  I saw and evaluated the patient. I agree with the findings and the plan of care as documented in the residents note.  Sharene Skeans, MD [facility] Family Medicine Fellow, [facility] for [facility], [place]

## 2020-04-20 LAB — GLUCOSE, CAPILLARY: Glucose-Capillary: 86 mg/dL (ref 70–99)

## 2020-04-20 LAB — CBC
HCT: 34 % — ABNORMAL LOW (ref 36.0–46.0)
Hemoglobin: 11 g/dL — ABNORMAL LOW (ref 12.0–15.0)
MCH: 29.5 pg (ref 26.0–34.0)
MCHC: 32.4 g/dL (ref 30.0–36.0)
MCV: 91.2 fL (ref 80.0–100.0)
Platelets: 226 10*3/uL (ref 150–400)
RBC: 3.73 MIL/uL — ABNORMAL LOW (ref 3.87–5.11)
RDW: 14.4 % (ref 11.5–15.5)
WBC: 15.9 10*3/uL — ABNORMAL HIGH (ref 4.0–10.5)
nRBC: 0 % (ref 0.0–0.2)

## 2020-04-20 LAB — CREATININE, SERUM
Creatinine, Ser: 0.6 mg/dL (ref 0.44–1.00)
GFR calc Af Amer: >60 mL/min (ref >60)
GFR calc non Af Amer: >60 mL/min (ref >60)

## 2020-04-20 NOTE — Lactation Note (Signed)
This note was copied from a baby's chart. Lactation Consultation Note  Patient Name: Crystal Joseph Date: 04/20/2020 Reason for consult: Follow-up assessment;Primapara;1st time breastfeeding;Other (Comment);Maternal endocrine disorder;Early term 73-38.6wks;Infant weight loss;Infant < 6lbs (SGA, [employer] employee) Type of Endocrine Disorder?: Diabetes (GDM)  Visited with mom of a 93 hours old ETI female < 6 lbs, she's been set up with a DEBP due to baby's birth weight and difficulty latching on (mom has inverted nipples), but she hasn't been pumping, she only pumped once.   RN Chloe reported to [facility] that she just worked with mom with the latch and baby did well with the NS # 16 but she may need further assistance. Asked mom to call for assistance when needed. Baby doing STS with mom when entering the room, he just had his bath, it was delayed past the 24 hours mark.  Mom has been supplementing with donor milk and the curve tip syringe but explained to parents that amounts for supplementation should slightly increase every 24 hours. Reviewed LPI supplementation guidelines due to baby's birth weight and ETI status. Discussed normal newborn behavior, feeding cues, cluster feeding, size of baby's stomach and pumping schedule.  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 She'll let her RN know to page lactation whenever she's made up her mind.  Feeding plan:  1. Encouraged mom to keep taking baby to breast STS on feeding cues, 8-12 times/24 hours, will use NS # 16 PRN 2. Parents will continue supplementing baby with donor milk after every feeding. Will start at 10 ml now; and at 20 ml once he turns 69 hours old. 3. She'll start pumping consistently every 3 hours  BF brochure was given, mom couldn't find hers. Dad present in the room at the time of [facility] consultation. Parents reported all questions and concerns were  answered, they're both aware of [place] OP services and will call PRN.   Maternal Data    Feeding Feeding Type: Breast Milk with Donor Milk  LATCH Score Latch: Grasps breast easily, tongue down, lips flanged, rhythmical sucking.  Audible Swallowing: A few with stimulation  Type of Nipple: Inverted  Comfort (Breast/Nipple): Soft / non-tender  Hold (Positioning): Full assist, staff holds infant at breast  LATCH Score: 5  Interventions Interventions: Breast feeding basics reviewed;DEBP  Lactation Tools Discussed/Used Tools: Pump Nipple shield size: 16 Breast pump type: Double-Electric Breast Pump   Consult Status Consult Status: Follow-up Date: 04/21/20 Follow-up type: In-patient    Crystal Joseph 04/20/2020, 2:18 PM

## 2020-04-20 NOTE — Progress Notes (Addendum)
Subjective: Postpartum Day 1: For Cesarean Delivery due to prolonged decel  Patient reports tolerating PO, + flatus and no problems voiding. Mild incisional pain.  Objective: Vital signs in last 24 hours: Temp:  [96.4 F (35.8 C)-99.2 F (37.3 C)] 98.6 F (37 C) (09/17 0523) Pulse Rate:  [50-97] 73 (09/17 0523) Resp:  [10-26] 16 (09/17 0523) BP: (90-111)/(44-81) 94/69 (09/17 0523) SpO2:  [96 %-100 %] 98 % (09/17 0523)  Physical Exam:  General: alert, cooperative, appears stated age and no distress Lochia: appropriate Uterine Fundus: firm Incision: healing well, no significant drainage, no significant erythema DVT Evaluation: No evidence of DVT seen on physical exam. No significant calf/ankle edema.  Recent Labs    04/18/20 0936 04/20/20 0503  HGB 12.5 11.0*  HCT 38.8 34.0*    Assessment/Plan: Status post Cesarean section for prolonged decel. Doing well postoperatively. Hemoglobin stable.  A2GDM - Previously on Metformin.  - CBG's stable, 66-84.  Considering outpatient Nexplanon.   Dispo: Plan for d/c tomorrow   Sharion Settler 04/20/2020, 7:52 AM  Attestation of Supervision of Student:  I confirm that I have verified the information documented in the  resident's note and that I have also personally reperformed the history, physical exam and all medical decision making activities.  I have verified that all services and findings are accurately documented in this student's note; and I agree with management and plan as outlined in the documentation. I have also made any necessary editorial changes.  Randa Ngo, [place] for [facility], [facility] 04/20/2020 9:08 AM

## 2020-04-21 MED ORDER — ACETAMINOPHEN 500 MG PO TABS
1000.0000 mg | ORAL_TABLET | Freq: Three times a day (TID) | ORAL | Status: DC
  Administered 2020-04-21: 1000 mg via ORAL
  Filled 2020-04-21: qty 2

## 2020-04-21 MED ORDER — COCONUT OIL OIL: 1.0000 "application " | TOPICAL_OIL | 0 refills | Status: DC | PRN

## 2020-04-21 MED ORDER — IBUPROFEN 800 MG PO TABS
800.0000 mg | ORAL_TABLET | Freq: Three times a day (TID) | ORAL | 0 refills | Status: DC
Start: 2020-04-21 — End: 2020-05-17

## 2020-04-21 MED ORDER — ACETAMINOPHEN 500 MG PO TABS
1000.0000 mg | ORAL_TABLET | Freq: Three times a day (TID) | ORAL | 0 refills | Status: DC
Start: 2020-04-21 — End: 2020-05-17

## 2020-04-21 MED ORDER — OXYCODONE HCL 5 MG PO TABS
5.0000 mg | ORAL_TABLET | Freq: Four times a day (QID) | ORAL | 0 refills | Status: DC | PRN
Start: 2020-04-21 — End: 2020-05-17

## 2020-04-21 NOTE — Progress Notes (Signed)
CSW received consult due to score 10 on Edinburgh Depression Screen.    CSW met with [name] at bedside to discuss edinburgh score 10, FOB present. CSW introduced self and explained reason for consult. [name] granted CSW verbal permission to speak in front of FOB about anything. CSW and [name] discussed [name]'s edinburgh score 10. [name] reported that she feels her score was a little high because she completed the screen while she was frustrated about her milk not coming in yet. [name] reported that she feels much better now that her milk was in. CSW inquired about [name]'s mental health history, [name] reported that she experienced depression 10 years ago. [name] reported that she took medication for 2 weeks and then stopped. [name] reported that she then took a break from school and didn't experience any further depressive symptoms. [name] denied any current depressive symptoms and denied any other mental health history. CSW inquired about [name]'s support system, [name] reported that her parents are supports. [name] reported that they have all items needed to care for infant including a car seat and crib. [name] presented calm and did not demonstrate any acute mental health signs/symptoms. CSW assessed for safety, [name] denied SI, HI and domestic violence.   CSW provided education regarding Baby Blues vs PMADs and provided [name] with resources for mental health follow up.  CSW encouraged [name] to evaluate her mental health throughout the postpartum period with the use of the New Mom Checklist developed by [employer]partum [employer] as well as the Edinburgh Postnatal Depression Scale and notify a medical professional if symptoms arise.    CSW provided review of Sudden Infant Death Syndrome (SIDS) precautions.    CSW identifies no further need for intervention and no barriers to discharge at this time.  Crystal Flott, LCSW Clinical Social Worker [facility] Cell#: [phone] 

## 2020-04-21 NOTE — Discharge Instructions (Signed)

## 2020-04-21 NOTE — Lactation Note (Signed)
This note was copied from a baby's chart. Lactation Consultation Note  Patient Name: Crystal Joseph [id number] Date: 04/21/2020 Reason for consult: Follow-up assessment  P1 mother whose infant is now 51 hours old.  This is an ETI at 38+2 weeks weighing < 6 lbs.  Mother's feeding preference is breast/bottle.  Baby was asleep in the bassinet when I arrived.  Mother has been breast feeding and supplementing with donor breast milk.   It was mother's choice to supplement only with donor milk last night.   Mother has inverted nipples and has been using a NS to assist with latching.  She is feeling more confident with using the NS and will continue using this at home as needed.  Showed mother how to pre-pump with the manual pump to help elongate nipple prior to latching.  Mother has been feeding on cue or at least every three hours.  Suggested mother increase supplementation volumes to 30 mls or more as baby desires.  Suggested she put baby to the breast with every feeding, followed by supplementation and then pumping with the DEBP for 15 minutes per session.  Mother was able to obtain 35 mls of EBM with the last pumping session this morning.  Engorgement prevention/treatment reviewed.  Provided [employer] employee pump.  All paperwork completed and placed in folder in cabinet on fifth floor.  Mother has our OP phone number for questions/concerns after discharge.  Father present and supportive.    Maternal Data    Feeding Feeding Type: Donor Breast Milk  LATCH Score                   Interventions    Lactation Tools Discussed/Used     Consult Status Consult Status: Complete Date: 04/21/20 Follow-up type: In-patient    [name] 04/21/2020, 8:34 AM

## 2020-05-01 ENCOUNTER — Inpatient Hospital Stay (HOSPITAL_COMMUNITY): Admit: 2020-05-01 | Payer: Self-pay

## 2020-05-16 ENCOUNTER — Ambulatory Visit: Payer: 59 | Admitting: Family Medicine

## 2020-05-17 ENCOUNTER — Encounter: Payer: Self-pay | Admitting: Family Medicine

## 2020-05-17 ENCOUNTER — Other Ambulatory Visit: Payer: Self-pay

## 2020-05-17 ENCOUNTER — Ambulatory Visit (INDEPENDENT_AMBULATORY_CARE_PROVIDER_SITE_OTHER): Payer: 59 | Admitting: Family Medicine

## 2020-05-17 DIAGNOSIS — O24419 Gestational diabetes mellitus in pregnancy, unspecified control: Secondary | ICD-10-CM

## 2020-05-17 NOTE — Progress Notes (Signed)
[place] Partum Visit Note  Crystal Joseph is a 29 y.o. G63P1001 female who presents for a postpartum visit. She is 4 weeks postpartum following a primary cesarean section.  I have fully reviewed the prenatal and intrapartum course. The delivery was at 53 gestational weeks.  Anesthesia: epidural. Postpartum course has been normal. Baby is doing well. Baby is feeding by breast. Bleeding staining only. Bowel function is normal. Bladder function is normal. Patient is not sexually active. Contraception method is none. Postpartum depression screening: negative.   The pregnancy intention screening data noted above was reviewed. Potential methods of contraception were discussed. The patient elected to proceed with IUD or IUS.    Edinburgh Postnatal Depression Scale - 05/17/20 0846      Edinburgh Postnatal Depression Scale:  In the Past 7 Days   I have been able to laugh and see the funny side of things. 0    I have looked forward with enjoyment to things. 0    I have blamed myself unnecessarily when things went wrong. 1    I have been anxious or worried for no good reason. 1    I have felt scared or panicky for no good reason. 1    Things have been getting on top of me. 0    I have been so unhappy that I have had difficulty sleeping. 0    I have felt sad or miserable. 0    I have been so unhappy that I have been crying. 0    The thought of harming myself has occurred to me. 0    Edinburgh Postnatal Depression Scale Total 3            The following portions of the patient's history were reviewed and updated as appropriate: allergies, current medications, past family history, past medical history, past social history, past surgical history and problem list.  Review of Systems Pertinent items are noted in HPI.    Objective:  Blood pressure 99/68, pulse 76, height 4\' 11"  (1.499 m), weight 119 lb (54 kg), last menstrual period 07/26/2019, unknown if currently breastfeeding.  General:  alert,  cooperative and no distress  Lungs: clear to auscultation bilaterally  Heart:  regular rate and rhythm, S1, S2 normal, no murmur, click, rub or gallop  Abdomen: soft, non-tender; bowel sounds normal; no masses,  no organomegaly. Incision clean, dry, intact. Well healed.        Assessment:    Normal postpartum exam. GDM. Pap smear not done at today's visit.   Plan:   Essential components of care per ACOG recommendations:  1.  Mood and well being: Patient with negative depression screening today. Reviewed local resources for support.  - Patient does not use tobacco.  - hx of drug use? No    2. Infant care and feeding:  -Patient currently breastmilk feeding? Yes  -Social determinants of health (SDOH) reviewed in EPIC. No concerns  3. Sexuality, contraception and birth spacing - Patient does not want a pregnancy in the next year.   - Reviewed forms of contraception in tiered fashion. Patient desired IUD today.   - Discussed birth spacing of 18 months  4. Sleep and fatigue -Encouraged family/partner/community support of 4 hrs of uninterrupted sleep to help with mood and fatigue  5. Physical Recovery  - Discussed patients delivery and complications - Patient has urinary incontinence? No  - Patient is safe to resume physical and sexual activity  6.  Health Maintenance  7. GDM HgA1c @  68months.  [place] for [employer], [place]

## 2020-06-04 MED FILL — PRENATAL 27-1 MG TABS: 27-1 | 90 days supply | Qty: 90 | Fill #4

## 2020-06-20 ENCOUNTER — Encounter: Payer: Self-pay | Admitting: Family Medicine

## 2020-06-20 ENCOUNTER — Ambulatory Visit (INDEPENDENT_AMBULATORY_CARE_PROVIDER_SITE_OTHER): Payer: 59 | Admitting: Family Medicine

## 2020-06-20 ENCOUNTER — Other Ambulatory Visit: Payer: Self-pay

## 2020-06-20 VITALS — BP 110/54 | HR 75 | Wt 122.0 lb

## 2020-06-20 DIAGNOSIS — Z30430 Encounter for insertion of intrauterine contraceptive device: Secondary | ICD-10-CM

## 2020-06-20 LAB — POCT URINE PREGNANCY: Preg Test, Ur: NEGATIVE

## 2020-06-20 MED ORDER — LEVONORGESTREL 19.5 MCG/DAY IU IUD
INTRAUTERINE_SYSTEM | Freq: Once | INTRAUTERINE | Status: AC
  Administered 2020-06-20: 1 via INTRAUTERINE

## 2020-06-20 NOTE — Patient Instructions (Signed)

## 2020-06-20 NOTE — Progress Notes (Signed)
IUD Procedure Note Patient identified, informed consent performed, signed copy in chart, time out was performed.  Urine pregnancy test negative.  Speculum placed in the vagina.  Cervix visualized.  Cleaned with Betadine x 2.  Grasped anteriorly with a single tooth tenaculum.  Uterus sounded to 8 cm.  Liletta  IUD placed per manufacturer's recommendations.  Strings trimmed to 3 cm. Tenaculum was removed, good hemostasis noted.  Patient tolerated procedure well.   Patient given post procedure instructions and Liletta care card with expiration date.  Patient is asked to check IUD strings periodically and follow up in 4-6 weeks for IUD check.

## 2020-07-18 ENCOUNTER — Ambulatory Visit: Payer: 59 | Admitting: Family Medicine

## 2020-07-25 ENCOUNTER — Ambulatory Visit: Payer: 59 | Admitting: Medical

## 2020-07-25 ENCOUNTER — Ambulatory Visit (INDEPENDENT_AMBULATORY_CARE_PROVIDER_SITE_OTHER): Payer: 59 | Admitting: Family Medicine

## 2020-07-25 ENCOUNTER — Encounter: Payer: Self-pay | Admitting: Family Medicine

## 2020-07-25 ENCOUNTER — Other Ambulatory Visit: Payer: Self-pay

## 2020-07-25 VITALS — BP 111/69 | HR 74 | Wt 121.0 lb

## 2020-07-25 DIAGNOSIS — Z30431 Encounter for routine checking of intrauterine contraceptive device: Secondary | ICD-10-CM | POA: Diagnosis not present

## 2020-07-25 DIAGNOSIS — Z7689 Persons encountering health services in other specified circumstances: Secondary | ICD-10-CM | POA: Diagnosis not present

## 2020-07-25 DIAGNOSIS — Z8632 Personal history of gestational diabetes: Secondary | ICD-10-CM

## 2020-07-25 NOTE — Progress Notes (Signed)
   Subjective:   Patient Name: Crystal Joseph, female   DOB: 09-30-1990, 29 y.o.  MRN: [id number]  HPI Patient here for an IUD check.  She had the [place] IUD placed 1 month ago.  She reports spotting.   Review of Systems  Constitutional: Negative for fever and chills.  Gastrointestinal: Negative for abdominal pain.  Genitourinary: Negative for vaginal discharge, vaginal pain, pelvic pain and dyspareunia.        Objective:   Physical Exam  Constitutional: She appears well-developed and well-nourished.  HENT:  Head: Normocephalic and atraumatic.  Abdominal: Soft. There is no tenderness. There is no guarding.  Genitourinary: There is no rash, tenderness or lesion on the right labia. There is no rash, tenderness or lesion on the left labia. No erythema or tenderness in the vagina. No foreign body around the vagina. No signs of injury around the vagina. No vaginal discharge found.    Skin: Skin is warm and dry.  Psychiatric: She has a normal mood and affect. Her behavior is normal. Judgment and thought content normal.       Assessment & Plan:  1. IUD check up IUD in place.  Pt to call with any other problems.  Recheck in 1 year.  2. History of gestational diabetes Check HgA1c - Hemoglobin A1c

## 2020-07-25 NOTE — Progress Notes (Signed)
Patient presents for string check. Cylinda Santoli RN  ?

## 2020-07-26 LAB — HEMOGLOBIN A1C
Est. average glucose Bld gHb Est-mCnc: 117 mg/dL
Hgb A1c MFr Bld: 5.7 % — ABNORMAL HIGH (ref 4.8–5.6)

## 2020-08-02 DIAGNOSIS — N939 Abnormal uterine and vaginal bleeding, unspecified: Secondary | ICD-10-CM

## 2020-08-14 ENCOUNTER — Other Ambulatory Visit: Payer: Self-pay | Admitting: Family Medicine

## 2020-08-14 ENCOUNTER — Other Ambulatory Visit: Payer: Self-pay

## 2020-08-14 MED ORDER — MEDROXYPROGESTERONE ACETATE 10 MG PO TABS: 10.0000 mg | ORAL_TABLET | Freq: Every day | ORAL | 0 refills | Status: DC

## 2020-08-14 NOTE — Addendum Note (Signed)
Addended by: Truett Mainland on: 08/14/2020 09:07 PM   Modules accepted: Orders

## 2020-08-15 ENCOUNTER — Other Ambulatory Visit: Payer: Self-pay

## 2020-08-15 ENCOUNTER — Encounter: Payer: Self-pay | Admitting: Medical

## 2020-08-15 ENCOUNTER — Ambulatory Visit: Payer: 59 | Admitting: Medical

## 2020-08-15 VITALS — BP 95/58 | HR 64 | Temp 97.7°F | Resp 18 | Ht 59.0 in | Wt 117.4 lb

## 2020-08-15 DIAGNOSIS — Z0000 Encounter for general adult medical examination without abnormal findings: Secondary | ICD-10-CM | POA: Diagnosis not present

## 2020-08-15 DIAGNOSIS — Z113 Encounter for screening for infections with a predominantly sexual mode of transmission: Secondary | ICD-10-CM

## 2020-08-15 LAB — CBC WITH DIFFERENTIAL/PLATELET
Basophils Absolute: 0 10*3/uL (ref 0.0–0.1)
Basophils Relative: 0.5 % (ref 0.0–3.0)
Eosinophils Absolute: 0.2 10*3/uL (ref 0.0–0.7)
Eosinophils Relative: 2.2 % (ref 0.0–5.0)
HCT: 43.9 % (ref 36.0–46.0)
Hemoglobin: 14.3 g/dL (ref 12.0–15.0)
Lymphocytes Relative: 27.3 % (ref 12.0–46.0)
Lymphs Abs: 2.2 10*3/uL (ref 0.7–4.0)
MCHC: 32.5 g/dL (ref 30.0–36.0)
MCV: 90 fl (ref 78.0–100.0)
Monocytes Absolute: 0.4 10*3/uL (ref 0.1–1.0)
Monocytes Relative: 4.4 % (ref 3.0–12.0)
Neutro Abs: 5.4 10*3/uL (ref 1.4–7.7)
Neutrophils Relative %: 65.6 % (ref 43.0–77.0)
Platelets: 312 10*3/uL (ref 150.0–400.0)
RBC: 4.88 Mil/uL (ref 3.87–5.11)
RDW: 14.6 % (ref 11.5–15.5)
WBC: 8.2 10*3/uL (ref 4.0–10.5)

## 2020-08-15 LAB — LIPID PANEL
Cholesterol: 170 mg/dL (ref 0–200)
HDL: 79.9 mg/dL (ref >39.00)
LDL Cholesterol: 84 mg/dL (ref 0–99)
NonHDL: 89.99
Total CHOL/HDL Ratio: 2
Triglycerides: 28 mg/dL (ref 0.0–149.0)
VLDL: 5.6 mg/dL (ref 0.0–40.0)

## 2020-08-15 LAB — COMPREHENSIVE METABOLIC PANEL
ALT: 21 U/L (ref 0–35)
AST: 17 U/L (ref 0–37)
Albumin: 5.1 g/dL (ref 3.5–5.2)
Alkaline Phosphatase: 97 U/L (ref 39–117)
BUN: 21 mg/dL (ref 6–23)
CO2: 29 mEq/L (ref 19–32)
Calcium: 9.8 mg/dL (ref 8.4–10.5)
Chloride: 104 mEq/L (ref 96–112)
Creatinine, Ser: 0.56 mg/dL (ref 0.40–1.20)
GFR: 121.16 mL/min (ref >60.00)
Glucose, Bld: 77 mg/dL (ref 70–99)
Potassium: 4.5 mEq/L (ref 3.5–5.1)
Sodium: 139 mEq/L (ref 135–145)
Total Bilirubin: 0.5 mg/dL (ref 0.2–1.2)
Total Protein: 8.5 g/dL — ABNORMAL HIGH (ref 6.0–8.3)

## 2020-08-15 MED FILL — MEDROXYPROGESTERONE 10 MG T: 10 | 30 days supply | Qty: 30 | Fill #0

## 2020-08-15 NOTE — Progress Notes (Signed)
Subjective:    Patient ID: Crystal Joseph, female    DOB: 16-May-1991, 30 y.o.   MRN: [id number]  HPI   Pt in for first time.  Pt works for [employer]. Pt does not exercise regularly. Walks a lot at work. Has 28 month old boy. Recently eating better/less sugar. No alcohol use. No caffeine. Pt is breast feeding. May breast feed up to one year.   Pt dx with gestational diabetes with recent pregancy. Pt a1c recently was 5.7 on 07/25/20.  Bp always on low side per pt.  Review of Systems  Constitutional: Negative for chills, fatigue and fever.  HENT: Negative for congestion, drooling, facial swelling and nosebleeds.   Respiratory: Negative for cough, chest tightness, shortness of breath and wheezing.   Cardiovascular: Negative for chest pain and palpitations.  Gastrointestinal: Negative for abdominal pain, constipation, nausea and vomiting.  Genitourinary: Negative for dysuria.  Musculoskeletal: Negative for back pain, joint swelling, myalgias and neck stiffness.  Skin: Negative for rash.  Neurological: Negative for facial asymmetry, numbness and headaches.  Hematological: Negative for adenopathy. Does not bruise/bleed easily.  Psychiatric/Behavioral: Negative for behavioral problems, confusion, dysphoric mood, sleep disturbance and suicidal ideas. The patient is not nervous/anxious.     Past Medical History:  Diagnosis Date  . Gestational diabetes    not diabetic. last a1c prediabetic range.  Marland Kitchen Uterine fibroid      Social History   Socioeconomic History  . Marital status: Married    Spouse name: Not on file  . Number of children: Not on file  . Years of education: Not on file  . Highest education level: Not on file  Occupational History  . Occupation: nurse  Tobacco Use  . Smoking status: Never Smoker  . Smokeless tobacco: Never Used  Vaping Use  . Vaping Use: Never used  Substance and Sexual Activity  . Alcohol use: Never  . Drug use: Never  . Sexual  activity: Yes    Birth control/protection: None  Other Topics Concern  . Not on file  Social History Narrative  . Not on file   Social Determinants of Health   Financial Resource Strain: Not on file  Food Insecurity: Not on file  Transportation Needs: Not on file  Physical Activity: Not on file  Stress: Not on file  Social Connections: Not on file  Intimate Partner Violence: Not on file    Past Surgical History:  Procedure Laterality Date  . CESAREAN SECTION N/A 04/19/2020   Procedure: CESAREAN SECTION;  Surgeon: Osborne Oman, MD;  Location: [facility] LD ORS;  Service: Obstetrics;  Laterality: N/A;  . NO PAST SURGERIES      History reviewed. No pertinent family history.  No Known Allergies  Current Outpatient Medications on File Prior to Visit  Medication Sig Dispense Refill  . medroxyPROGESTERone (PROVERA) 10 MG tablet Take 1 tablet (10 mg total) by mouth daily. 30 tablet 0  . prenatal vitamin w/FE, FA (PRENATAL 1 + 1) 27-1 MG TABS tablet Take 1 tablet by mouth daily at 12 noon. 30 tablet 11  . coconut oil OIL Apply 1 application topically as needed (nipple pain). (Patient not taking: No sig reported)  0   No current facility-administered medications on file prior to visit.    BP (!) 95/58   Pulse 64   Temp 97.7 F (36.5 C) (Oral)   Resp 18   Ht 4\' 11"  (1.499 m)   Wt 117 lb 6.4 oz (53.3 kg)  SpO2 100%   BMI 23.71 kg/m       Objective:   Physical Exam  General Mental Status- Alert. General Appearance- Not in acute distress.   Skin General: Color- Normal Color. Moisture- Normal Moisture.  Neck Carotid Arteries- Normal color. Moisture- Normal Moisture. No carotid bruits. No JVD.  Chest and Lung Exam Auscultation: Breath Sounds:-Normal.  Cardiovascular Auscultation:Rythm- Regular. Murmurs & Other Heart Sounds:Auscultation of the heart reveals- No Murmurs.  Abdomen Inspection:-Inspeection Normal. Palpation/Percussion:Note:No mass. Palpation and  Percussion of the abdomen reveal- Non Tender, Non Distended + BS, no rebound or guarding.    Neurologic Cranial Nerve exam:- CN III-XII intact(No nystagmus), symmetric smile. Strength:- 5/5 equal and symmetric strength both upper and lower extremities.      Assessment & Plan:  For you wellness exam today I have ordered cbc, cmp, lipid panel and hiv.  Vaccine up to date.  Recommend exercise and healthy diet.  We will let you know lab results as they come in.  Follow up date appointment will be determined after lab review.   Gestational sugar increase but recent a1c 5.7. Low sugar diet and exercise. Repeat A1c in April.  Mackie Pai, PA-C

## 2020-08-15 NOTE — Patient Instructions (Addendum)
For you wellness exam today I have ordered cbc, cmp, lipid panel and hiv.  Vaccine up to date.  Recommend exercise and healthy diet.  We will let you know lab results as they come in.  Follow up date appointment will be determined after lab review.   Hx of gestational sugar increase but recent a1c 5.7. Low sugar diet and exercise. Repeat A1c in April.   Preventive Care 20-32 Years Old, Female Preventive care refers to lifestyle choices and visits with your health care provider that can promote health and wellness. This includes:  A yearly physical exam. This is also called an annual wellness visit.  Regular dental and eye exams.  Immunizations.  Screening for certain conditions.  Healthy lifestyle choices, such as: ? Eating a healthy diet. ? Getting regular exercise. ? Not using drugs or products that contain nicotine and tobacco. ? Limiting alcohol use. What can I expect for my preventive care visit? Physical exam Your health care provider may check your:  Height and weight. These may be used to calculate your BMI (body mass index). BMI is a measurement that tells if you are at a healthy weight.  Heart rate and blood pressure.  Body temperature.  Skin for abnormal spots. Counseling Your health care provider may ask you questions about your:  Past medical problems.  Family's medical history.  Alcohol, tobacco, and drug use.  Emotional well-being.  Home life and relationship well-being.  Sexual activity.  Diet, exercise, and sleep habits.  Work and work Statistician.  Access to firearms.  Method of birth control.  Menstrual cycle.  Pregnancy history. What immunizations do I need? Vaccines are usually given at various ages, according to a schedule. Your health care provider will recommend vaccines for you based on your age, medical history, and lifestyle or other factors, such as travel or where you work.   What tests do I need? Blood tests  Lipid  and cholesterol levels. These may be checked every 5 years starting at age 46.  Hepatitis C test.  Hepatitis B test. Screening  Diabetes screening. This is done by checking your blood sugar (glucose) after you have not eaten for a while (fasting).  STD (sexually transmitted disease) testing, if you are at risk.  BRCA-related cancer screening. This may be done if you have a family history of breast, ovarian, tubal, or peritoneal cancers.  Pelvic exam and Pap test. This may be done every 3 years starting at age 34. Starting at age 107, this may be done every 5 years if you have a Pap test in combination with an HPV test. Talk with your health care provider about your test results, treatment options, and if necessary, the need for more tests.   Follow these instructions at home: Eating and drinking  Eat a healthy diet that includes fresh fruits and vegetables, whole grains, lean protein, and low-fat dairy products.  Take vitamin and mineral supplements as recommended by your health care provider.  Do not drink alcohol if: ? Your health care provider tells you not to drink. ? You are pregnant, may be pregnant, or are planning to become pregnant.  If you drink alcohol: ? Limit how much you have to 0-1 drink a day. ? Be aware of how much alcohol is in your drink. In the [place] one drink equals one 12 oz bottle of beer (355 mL), one 5 oz glass of wine (148 mL), or one 1 oz glass of hard liquor (44 mL).   Lifestyle  Take daily care of your teeth and gums. Brush your teeth every morning and night with fluoride toothpaste. Floss one time each day.  Stay active. Exercise for at least 30 minutes 5 or more days each week.  Do not use any products that contain nicotine or tobacco, such as cigarettes, e-cigarettes, and chewing tobacco. If you need help quitting, ask your health care provider.  Do not use drugs.  If you are sexually active, practice safe sex. Use a condom or other form of  protection to prevent STIs (sexually transmitted infections).  If you do not wish to become pregnant, use a form of birth control. If you plan to become pregnant, see your health care provider for a prepregnancy visit.  Find healthy ways to cope with stress, such as: ? Meditation, yoga, or listening to music. ? Journaling. ? Talking to a trusted person. ? Spending time with friends and family. Safety  Always wear your seat belt while driving or riding in a vehicle.  Do not drive: ? If you have been drinking alcohol. Do not ride with someone who has been drinking. ? When you are tired or distracted. ? While texting.  Wear a helmet and other protective equipment during sports activities.  If you have firearms in your house, make sure you follow all gun safety procedures.  Seek help if you have been physically or sexually abused. What's next?  Go to your health care provider once a year for an annual wellness visit.  Ask your health care provider how often you should have your eyes and teeth checked.  Stay up to date on all vaccines. This information is not intended to replace advice given to you by your health care provider. Make sure you discuss any questions you have with your health care provider. Document Revised: 03/18/2020 Document Reviewed: 04/01/2018 Elsevier Patient Education  2021 [employer].

## 2020-08-16 LAB — HIV ANTIBODY (ROUTINE TESTING W REFLEX): HIV 1&2 Ab, 4th Generation: NONREACTIVE

## 2020-08-23 ENCOUNTER — Other Ambulatory Visit: Payer: Self-pay | Admitting: Medical

## 2020-08-23 ENCOUNTER — Telehealth: Payer: Self-pay | Admitting: Medical

## 2020-08-23 ENCOUNTER — Encounter: Payer: Self-pay | Admitting: Medical

## 2020-08-23 MED ORDER — HYDROCORTISONE ACETATE 25 MG RE SUPP: 25.0000 mg | Freq: Two times a day (BID) | RECTAL | 0 refills | Status: DC

## 2020-08-23 MED FILL — HYDROCORTISONE AC 25 MG SUP: 25 | 6 days supply | Qty: 12 | Fill #0

## 2020-08-23 NOTE — Telephone Encounter (Signed)
Rx anusol hc sent to pharmacy.

## 2020-08-23 NOTE — Telephone Encounter (Signed)
Opened to review, °

## 2020-08-28 DIAGNOSIS — N839 Noninflammatory disorder of ovary, fallopian tube and broad ligament, unspecified: Secondary | ICD-10-CM | POA: Diagnosis not present

## 2020-08-28 DIAGNOSIS — N939 Abnormal uterine and vaginal bleeding, unspecified: Secondary | ICD-10-CM | POA: Diagnosis not present

## 2020-08-28 NOTE — Addendum Note (Signed)
Addended by: Truett Mainland on: 08/28/2020 02:42 PM   Modules accepted: Orders

## 2020-09-05 ENCOUNTER — Other Ambulatory Visit (HOSPITAL_BASED_OUTPATIENT_CLINIC_OR_DEPARTMENT_OTHER): Payer: 59

## 2020-09-05 ENCOUNTER — Other Ambulatory Visit: Payer: Self-pay | Admitting: Medical

## 2020-09-05 ENCOUNTER — Other Ambulatory Visit: Payer: Self-pay

## 2020-09-05 ENCOUNTER — Telehealth (INDEPENDENT_AMBULATORY_CARE_PROVIDER_SITE_OTHER): Payer: 59 | Admitting: Medical

## 2020-09-05 DIAGNOSIS — J029 Acute pharyngitis, unspecified: Secondary | ICD-10-CM | POA: Diagnosis not present

## 2020-09-05 MED ORDER — AZITHROMYCIN 250 MG PO TABS: ORAL_TABLET | ORAL | 0 refills | Status: DC

## 2020-09-05 MED FILL — AZITHROMYCIN 250 MG TABLET: 250 | 5 days supply | Qty: 6 | Fill #0

## 2020-09-05 NOTE — Progress Notes (Signed)
   Subjective:    Patient ID: Crystal Joseph, female    DOB: Feb 17, 1991, 30 y.o.   MRN: [id number]  HPI  Virtual Visit via Video Note  I connected with Crystal Joseph on 09/05/20 at 10:00 AM EST by a video enabled telemedicine application and verified that I am speaking with the correct person using two identifiers.  Location: Patient: home Provider: office  particpants- pt and myself.   I discussed the limitations of evaluation and management by telemedicine and the availability of in person appointments. The patient expressed understanding and agreed to proceed.  History of Present Illness: St since Saturday. Mild but last 2 days throat is getting worse. She states just now seeing that her left tonsil swollen and red. Some white spot appearance to tonsil. She states swelling is on left side. She feels around left submnandibular node and feels swollen but not red.  Pt had covid test yesterday and was negative. She thinks it was pcr.    Observations/Objective:  General-no acute distress, pleasant, oriented. Lungs- on inspection lungs appear unlabored. Neck- no tracheal deviation or jvd on inspection. Neuro- gross motor function appears intact. HEENT-self palpation but no pain on palpation.  Neck stiffness.  On attempted visualization of throat did not see clear pharynx or tonsil.  Poor  angle and shadow on attempted viewing.  Assessment and Plan: Pharyngitis and lymphadenopathy by video visit. No other sign/symptoms. covid test negative.   Recommend rest, hydrate, warm salt water gargles and start azithromycin to cover strep.  If signs and symptoms persist or change in did explain want to leave the area.  Follow-up in 7 to 10 days or as needed.  Follow Up Instructions:    I discussed the assessment and treatment plan with the patient. The patient was provided an opportunity to ask questions and all were answered. The patient agreed with the plan and demonstrated an understanding of  the instructions.   The patient was advised to call back or seek an in-person evaluation if the symptoms worsen or if the condition fails to improve as anticipated.  Time spent with patient today was14   minutes which consisted of chart review, discussing diagnosis,  treatment and documentation.   Mackie Pai, PA-C   Review of Systems  Constitutional: Positive for chills. Negative for fatigue and fever.  HENT: Positive for sore throat. Negative for congestion, ear discharge, ear pain, postnasal drip, sinus pressure and sinus pain.   Respiratory: Negative for cough and shortness of breath.   Cardiovascular: Negative for chest pain and palpitations.  Gastrointestinal: Negative for abdominal pain.  Musculoskeletal: Negative for back pain and joint swelling.  Skin: Negative for rash.  Neurological: Negative for dizziness and headaches.  Hematological: Positive for adenopathy. Does not bruise/bleed easily.       Objective:   Physical Exam        Assessment & Plan:

## 2020-09-05 NOTE — Patient Instructions (Addendum)
Pharyngitis and lymphadenopathy by video visit. No other sign/symptoms. covid test negative.   Recommend rest, hydrate, warm salt water gargles and start azithromycin to cover strep.  If signs and symptoms persist or change in did explain want to leave the area.  Follow-up in 7 to 10 days or as needed.

## 2020-09-06 ENCOUNTER — Encounter (HOSPITAL_BASED_OUTPATIENT_CLINIC_OR_DEPARTMENT_OTHER): Payer: Self-pay

## 2020-09-06 ENCOUNTER — Ambulatory Visit (INDEPENDENT_AMBULATORY_CARE_PROVIDER_SITE_OTHER): Payer: 59

## 2020-09-06 ENCOUNTER — Other Ambulatory Visit: Payer: Self-pay

## 2020-09-06 ENCOUNTER — Ambulatory Visit (HOSPITAL_BASED_OUTPATIENT_CLINIC_OR_DEPARTMENT_OTHER)
Admission: RE | Admit: 2020-09-06 | Discharge: 2020-09-06 | Disposition: A | Discharge status: Home / Self Care | Payer: 59 | Source: Ambulatory Visit | Attending: Family Medicine | Admitting: Family Medicine

## 2020-09-06 DIAGNOSIS — N939 Abnormal uterine and vaginal bleeding, unspecified: Secondary | ICD-10-CM

## 2020-09-06 DIAGNOSIS — D259 Leiomyoma of uterus, unspecified: Secondary | ICD-10-CM | POA: Diagnosis not present

## 2020-09-12 MED FILL — PRENATAL 27-1 MG TABS: 27-1 | 30 days supply | Qty: 30 | Fill #5

## 2020-09-25 ENCOUNTER — Other Ambulatory Visit: Payer: Self-pay | Admitting: Family Medicine

## 2020-09-25 MED ORDER — MEDROXYPROGESTERONE ACETATE 10 MG PO TABS: 10.0000 mg | ORAL_TABLET | Freq: Every day | ORAL | 1 refills | Status: DC

## 2020-09-25 MED FILL — MEDROXYPROGESTERONE 10 MG T: 10 | 60 days supply | Qty: 60 | Fill #0

## 2020-10-10 ENCOUNTER — Other Ambulatory Visit: Payer: Self-pay | Admitting: Family Medicine

## 2020-10-10 DIAGNOSIS — Z3400 Encounter for supervision of normal first pregnancy, unspecified trimester: Secondary | ICD-10-CM

## 2020-10-11 ENCOUNTER — Other Ambulatory Visit: Payer: Self-pay

## 2020-10-11 ENCOUNTER — Other Ambulatory Visit (HOSPITAL_BASED_OUTPATIENT_CLINIC_OR_DEPARTMENT_OTHER): Payer: Self-pay | Admitting: Family Medicine

## 2020-10-11 MED ORDER — PRENATAL PLUS 27-1 MG PO TABS: 1.0000 | ORAL_TABLET | Freq: Every day | ORAL | 3 refills | Status: DC

## 2020-10-11 MED FILL — PRENATAL VITAMIN PLUS LOW I: 27-1 | 90 days supply | Qty: 90 | Fill #0

## 2020-10-23 ENCOUNTER — Other Ambulatory Visit: Payer: Self-pay | Admitting: Family Medicine

## 2020-10-29 ENCOUNTER — Encounter: Payer: Self-pay | Admitting: Medical

## 2020-10-29 ENCOUNTER — Telehealth (INDEPENDENT_AMBULATORY_CARE_PROVIDER_SITE_OTHER): Payer: 59 | Admitting: Family Medicine

## 2020-10-29 ENCOUNTER — Encounter: Payer: Self-pay | Admitting: Family Medicine

## 2020-10-29 ENCOUNTER — Other Ambulatory Visit: Payer: Self-pay

## 2020-10-29 ENCOUNTER — Other Ambulatory Visit (HOSPITAL_BASED_OUTPATIENT_CLINIC_OR_DEPARTMENT_OTHER): Payer: Self-pay | Admitting: Family Medicine

## 2020-10-29 DIAGNOSIS — J069 Acute upper respiratory infection, unspecified: Secondary | ICD-10-CM

## 2020-10-29 MED ORDER — AZELASTINE HCL 0.1 % NA SOLN
2.0000 | Freq: Two times a day (BID) | NASAL | 12 refills | Status: DC
Start: 2020-10-29 — End: 2021-06-13

## 2020-10-29 MED ORDER — BENZONATATE 100 MG PO CAPS: 100.0000 mg | ORAL_CAPSULE | Freq: Three times a day (TID) | ORAL | 0 refills | Status: DC | PRN

## 2020-10-29 MED FILL — AZELASTINE HCL 137 MCG/SPRA: 137 | 25 days supply | Qty: 30 | Fill #0

## 2020-10-29 MED FILL — BENZONATATE 100 MG CAPS: 100 | 10 days supply | Qty: 30 | Fill #0

## 2020-10-29 NOTE — Progress Notes (Signed)
Chief Complaint  Patient presents with  . Sinus Problem  . Chills  . Cough  . Fatigue    Crystal Joseph here for URI complaints. Due to COVID-19 pandemic, we are interacting via web portal for an electronic face-to-face visit. I verified patient's ID using 2 identifiers. Patient agreed to proceed with visit via this method. Patient is at home, I am at office. Patient and I are present for visit.   Duration: 2 days  Associated symptoms: sinus congestion, rhinorrhea, sore throat and cough Denies: sinus pain, itchy watery eyes, ear pain, ear drainage, wheezing, shortness of breath, myalgia and fevers Treatment to date: none Sick contacts: Yes; spouse tested neg for covid  Past Medical History:  Diagnosis Date  . Gestational diabetes    not diabetic. last a1c prediabetic range.  Marland Kitchen Uterine fibroid    Objective No conversational dyspnea Age appropriate judgment and insight Nml affect and mood  Viral URI with cough - Plan: azelastine (ASTELIN) 0.1 % nasal spray, benzonatate (TESSALON) 100 MG capsule  Spray should be fine, will pump and dump for 24 hours after last capsule taken.  Continue to push fluids, practice good hand hygiene, cover mouth when coughing. F/u prn. If starting to experience fevers, shaking, or shortness of breath, seek immediate care. Pt voiced understanding and agreement to the plan.  [place], DO 10/29/20 1:46 PM

## 2020-11-07 ENCOUNTER — Ambulatory Visit: Payer: 59 | Admitting: Family Medicine

## 2020-11-12 ENCOUNTER — Telehealth: Payer: Self-pay | Admitting: *Deleted

## 2020-11-12 DIAGNOSIS — R739 Hyperglycemia, unspecified: Secondary | ICD-10-CM

## 2020-11-12 NOTE — Telephone Encounter (Signed)
Crystal Joseph, Pt returned my call. Said her a1c at the end of December 2021 was slightly elevated and she was told she would need to repeat it again.  Please place future order if ok or let me know as soon as possible so I can contact pt before her appt at 8am tomorrow. Thank you!

## 2020-11-12 NOTE — Telephone Encounter (Signed)
Left message for pt to return my call and sent mychart message.

## 2020-11-12 NOTE — Telephone Encounter (Signed)
Pt has lab appt scheduled tomorrow but I do not see any future lab orders in Epic.  Please place orders if appropriate or call pt to cancel appt if not needed at this time.

## 2020-11-12 NOTE — Telephone Encounter (Signed)
I don't see that I recommended labs to be done? Last labs were ok that I ordered. What does she thinks is needed?

## 2020-11-12 NOTE — Telephone Encounter (Signed)
Ok to place a1c order. Dx will be elevated sugar.

## 2020-11-13 ENCOUNTER — Encounter: Payer: Self-pay | Admitting: Medical

## 2020-11-13 ENCOUNTER — Other Ambulatory Visit: Payer: Self-pay

## 2020-11-13 ENCOUNTER — Other Ambulatory Visit (INDEPENDENT_AMBULATORY_CARE_PROVIDER_SITE_OTHER): Payer: 59

## 2020-11-13 DIAGNOSIS — R739 Hyperglycemia, unspecified: Secondary | ICD-10-CM | POA: Diagnosis not present

## 2020-11-13 LAB — HEMOGLOBIN A1C: Hgb A1c MFr Bld: 6 % (ref 4.6–6.5)

## 2020-11-13 NOTE — Telephone Encounter (Signed)
Thank you. Orders has been placed.

## 2020-11-15 ENCOUNTER — Encounter: Payer: Self-pay | Admitting: General Practice

## 2020-11-28 ENCOUNTER — Ambulatory Visit (INDEPENDENT_AMBULATORY_CARE_PROVIDER_SITE_OTHER): Payer: 59 | Admitting: Family Medicine

## 2020-11-28 ENCOUNTER — Encounter: Payer: Self-pay | Admitting: Family Medicine

## 2020-11-28 ENCOUNTER — Other Ambulatory Visit: Payer: Self-pay

## 2020-11-28 VITALS — BP 112/76 | HR 79 | Wt 113.0 lb

## 2020-11-28 DIAGNOSIS — Z30432 Encounter for removal of intrauterine contraceptive device: Secondary | ICD-10-CM | POA: Diagnosis not present

## 2020-11-28 NOTE — Progress Notes (Signed)
Patient having recurrent bleeding despite additional OCP or progesterone. Here for IUD removal. Going to use condoms for now.  IUD Removal  Patient was in the dorsal lithotomy position, normal external genitalia was noted.  A speculum was placed in the patient's vagina, normal discharge was noted, no lesions. The multiparous cervix was visualized, no lesions, no abnormal discharge,  and was swabbed with Betadine using scopettes.  The strings of the IUD was grasped and pulled using ring forceps.  The IUD was successfully removed in its entirety.  Patient tolerated the procedure well.

## 2020-11-28 NOTE — Progress Notes (Signed)
Patient states the IUD makes her feel fatigued. Patient states she has scant bleeding for 1-2 months Kathrene Alu RN

## 2021-01-17 DIAGNOSIS — Z23 Encounter for immunization: Secondary | ICD-10-CM | POA: Diagnosis not present

## 2021-04-18 IMAGING — US US MFM OB COMP +14 WKS
1 series · 13 of 28 positions shown · non-contrast
Comparison: none

[Series 1: us mfm ob comp +14 wks · 13 of 112 slices shown]
[im 5/112]
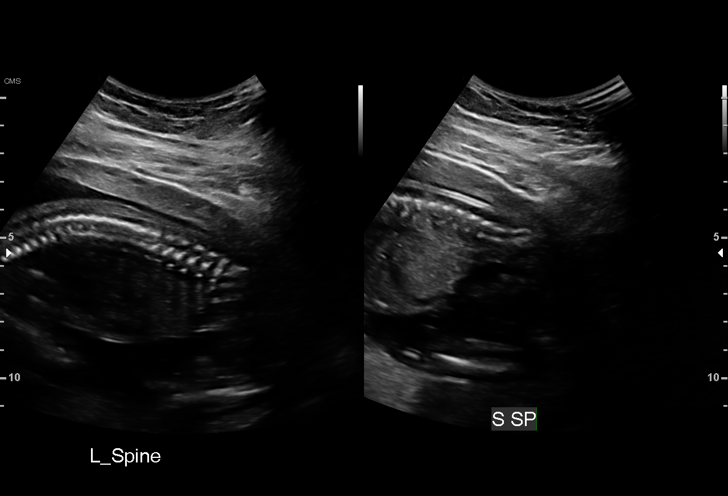
[im 13/112]
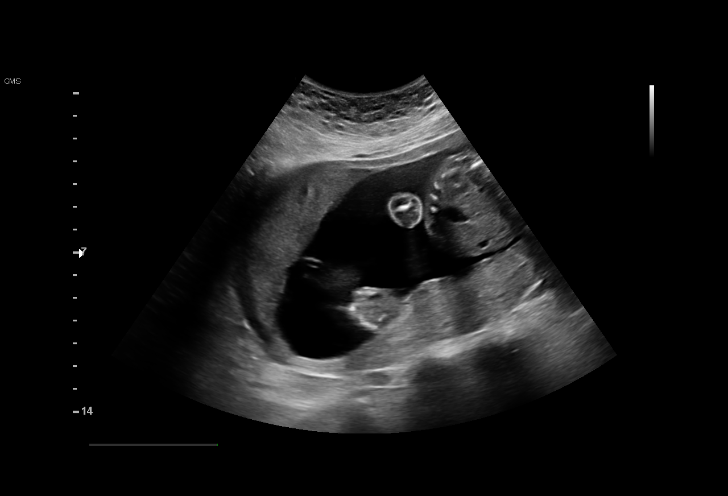
[im 21/112]
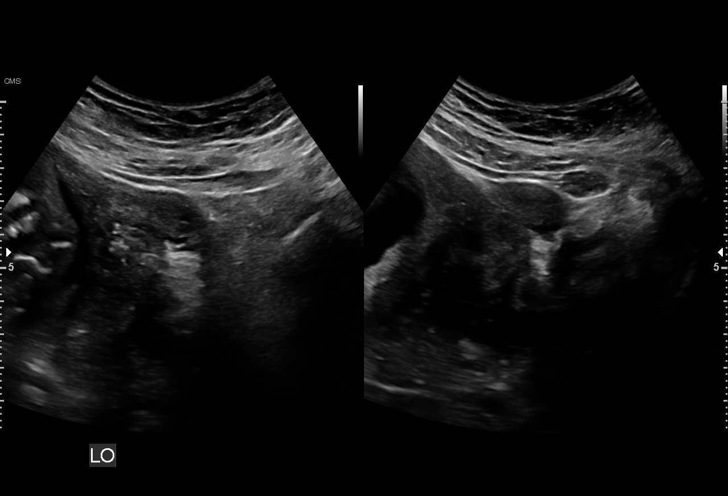
[im 29/112]
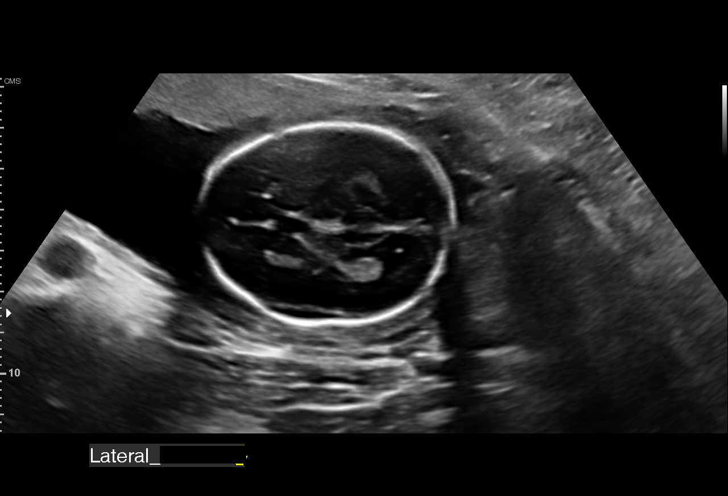
[im 38/112]
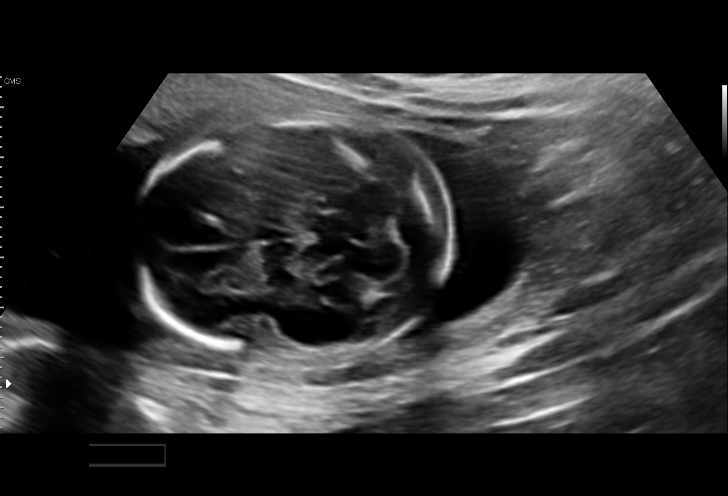
[im 46/112]
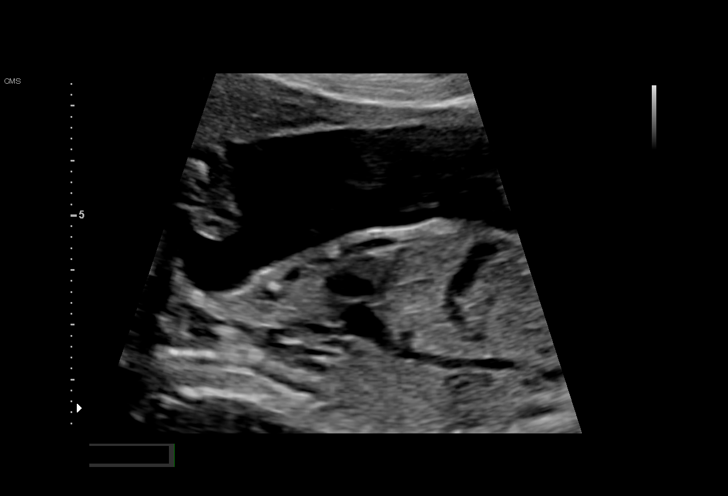
[im 58/112]
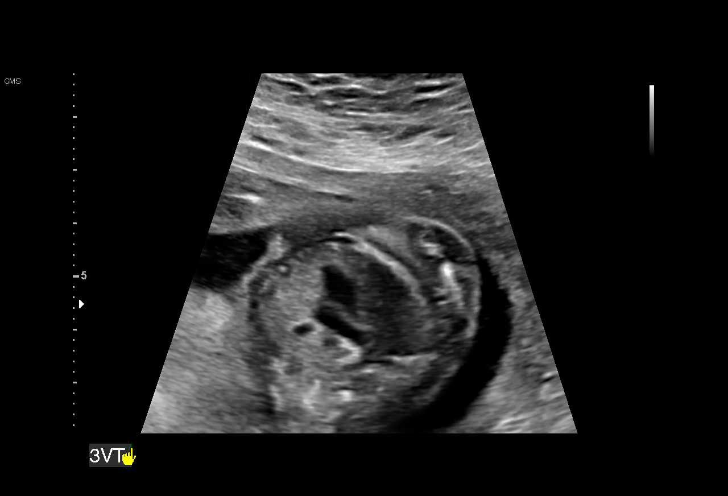
[im 66/112]
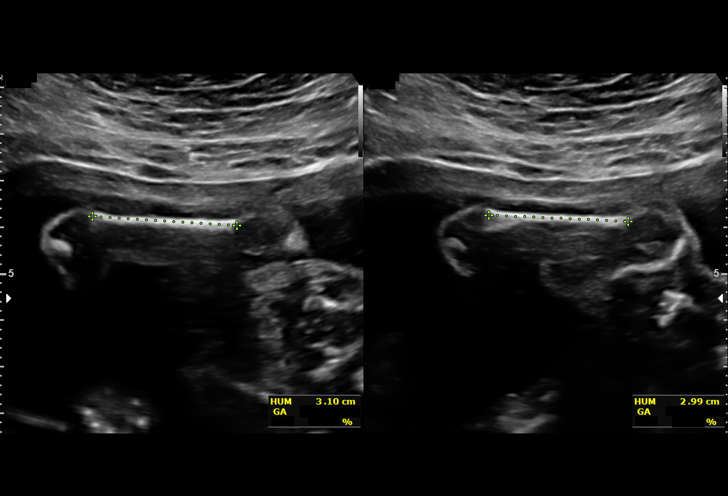
[im 75/112]
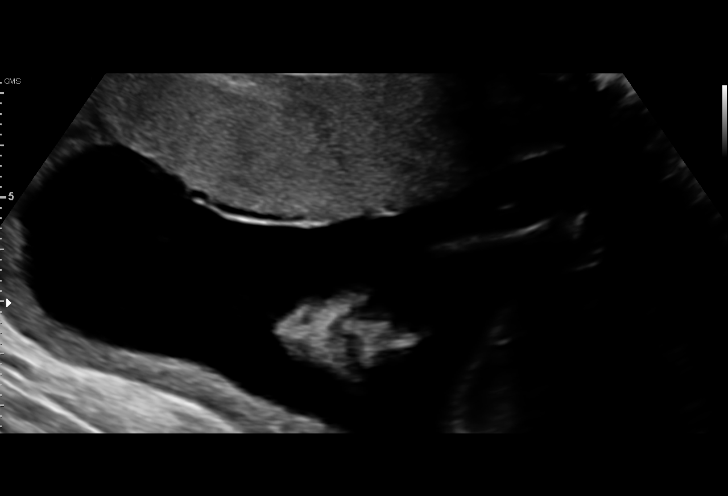
[im 83/112]
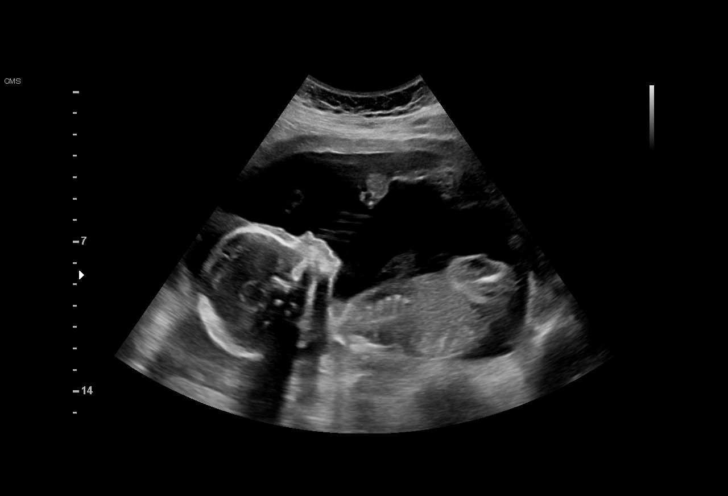
[im 91/112]
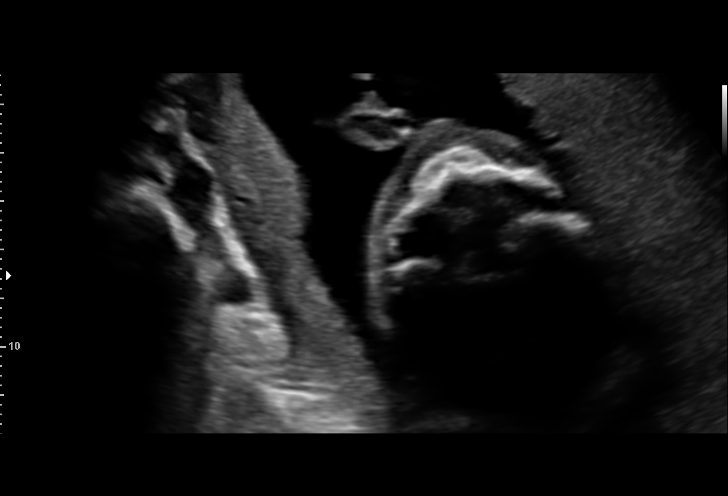
[im 99/112]
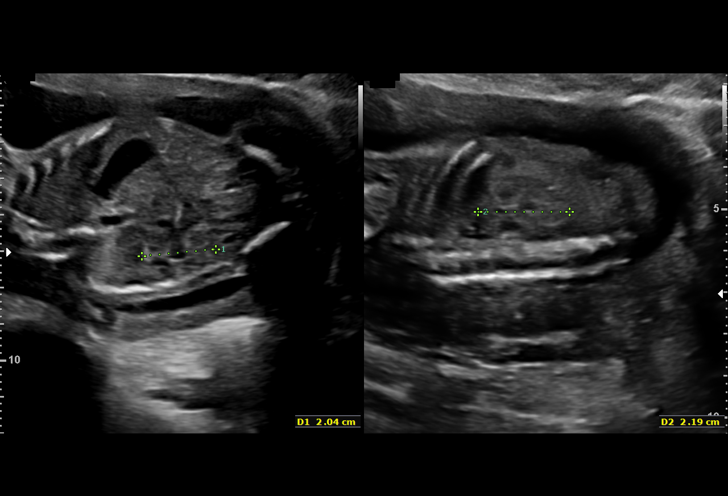
[im 107/112]
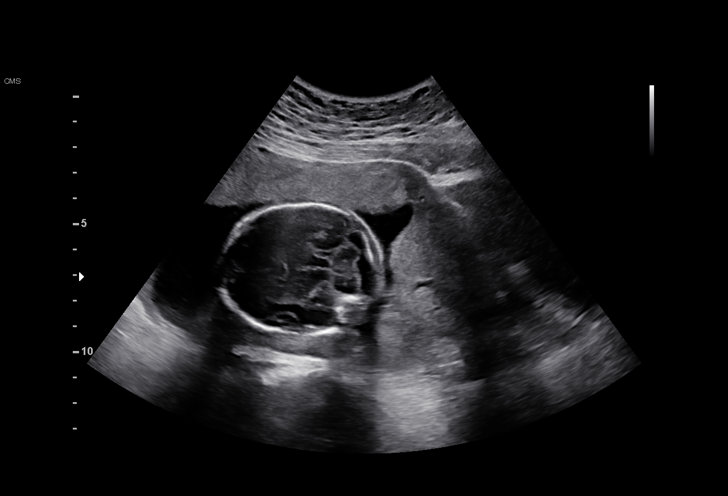

[13 of 28 positions shown; findings below may reference images not displayed]

Indications

 Uterine fibroids affecting pregnancy in        O34.12,
 second trimester, antepartum
 20 weeks gestation of pregnancy
 Antenatal screening for malformations (neg
 AFP only)
Fetal Evaluation

 Num Of Fetuses:         1
 Fetal Heart Rate(bpm):  155
 Cardiac Activity:       Observed
 Presentation:           Breech
 Placenta:               Anterior
 P. Cord Insertion:      Visualized

 Amniotic Fluid
 AFI FV:      Within normal limits

                             Largest Pocket(cm)
                             6
Biometry

 BPD:      48.9  mm     G. Age:  20w 6d         65  %    CI:        71.33   %    70 - 86
                                                         FL/HC:      17.1   %    16.8 -
 HC:      184.4  mm     G. Age:  20w 6d         59  %    HC/AC:      1.14        1.09 -
 AC:      161.9  mm     G. Age:  21w 2d         72  %    FL/BPD:     64.6   %
 FL:       31.6  mm     G. Age:  19w 6d         22  %    FL/AC:      19.5   %    20 - 24
 HUM:      30.9  mm     G. Age:  20w 2d         46  %
 CER:      22.4  mm     G. Age:  21w 1d         65  %
 NFT:       3.7  mm
 CM:        5.6  mm

 Est. FW:     367  gm    0 lb 13 oz      57  %
OB History

 Gravidity:    1         Term:   0        Prem:   0        SAB:   0
 TOP:          0       Ectopic:  0        Living: 0
Gestational Age

 LMP:           20w 3d        Date:  07/26/19                 EDD:   05/01/20
 U/S Today:     20w 5d                                        EDD:   04/29/20
 Best:          20w 3d     Det. By:  LMP  (07/26/19)          EDD:   05/01/20
Anatomy

 Cranium:               Appears normal         LVOT:                   Appears normal
 Cavum:                 Appears normal         Aortic Arch:            Appears normal
 Ventricles:            Appears normal         Ductal Arch:            Appears normal
 Choroid Plexus:        Appears normal         Diaphragm:              Appears normal
 Cerebellum:            Appears normal         Stomach:                Appears normal, left
                                                                       sided
 Posterior Fossa:       Appears normal         Abdomen:                Appears normal
 Nuchal Fold:           Appears normal         Abdominal Wall:         Appears nml (cord
                                                                       insert, abd wall)
 Face:                  Appears normal         Cord Vessels:           Appears normal (3
                        (orbits and profile)                           vessel cord)
 Lips:                  Appears normal         Kidneys:                Appear normal
 Palate:                Appears normal         Bladder:                Appears normal
 Thoracic:              Appears normal         Spine:                  Appears normal
 Heart:                 Appears normal         Upper Extremities:      Appears normal
                        (4CH, axis, and
                        situs)
 RVOT:                  Appears normal         Lower Extremities:      Appears normal

 Other:  Heels/feet and 5th digits visualized. Nasal bone visualized.
Cervix Uterus Adnexa

 Cervix
 Length:           3.42  cm.
 Normal appearance by transabdominal scan.

 Uterus
 Single fibroid noted, see table below.

 Right Ovary
 Not visualized.

 Left Ovary
 Within normal limits.

 Cul De Sac
 No free fluid seen.
Myomas

 Site                     L(cm)      W(cm)      D(cm)       Location
 Left LUS/Cervix

 Blood Flow                  RI       PI       Comments
Comments

 This patient was seen for a detailed fetal anatomy scan due
 to a fibroid uterus.
 She denies any significant past medical history and denies
 any problems in her current pregnancy.
 She has not had any screening tests for fetal aneuploidy
 drawn in her current pregnancy.
 She was informed that the fetal growth and amniotic fluid
 level were appropriate for her gestational age.
 There were no obvious fetal anomalies noted on today's
 ultrasound exam.
 The patient was informed that anomalies may be missed due
 to technical limitations. If the fetus is in a suboptimal position
 or maternal habitus is increased, visualization of the fetus in
 the maternal uterus may be impaired.
 Due to her fibroid uterus, a follow-up growth scan was
 scheduled at around 28 weeks.

## 2021-04-22 ENCOUNTER — Telehealth: Payer: Self-pay | Admitting: General Practice

## 2021-04-22 NOTE — Telephone Encounter (Signed)
Left message on VM for patient to contact our office  to schedule Annual Exam with Dr. Nehemiah Settle in November.

## 2021-06-13 ENCOUNTER — Ambulatory Visit (INDEPENDENT_AMBULATORY_CARE_PROVIDER_SITE_OTHER): Payer: 59 | Admitting: Family Medicine

## 2021-06-13 ENCOUNTER — Other Ambulatory Visit (HOSPITAL_COMMUNITY)
Admission: RE | Admit: 2021-06-13 | Discharge: 2021-06-13 | Disposition: A | Discharge status: Home / Self Care | Payer: 59 | Source: Ambulatory Visit | Attending: Family Medicine | Admitting: Family Medicine

## 2021-06-13 ENCOUNTER — Encounter: Payer: Self-pay | Admitting: Family Medicine

## 2021-06-13 ENCOUNTER — Other Ambulatory Visit: Payer: Self-pay

## 2021-06-13 VITALS — BP 105/62 | HR 78 | Ht 59.0 in | Wt 117.0 lb

## 2021-06-13 DIAGNOSIS — Z01419 Encounter for gynecological examination (general) (routine) without abnormal findings: Secondary | ICD-10-CM | POA: Diagnosis not present

## 2021-06-13 NOTE — Progress Notes (Signed)
GYNECOLOGY ANNUAL PREVENTATIVE CARE ENCOUNTER NOTE  Subjective:   Crystal Joseph is a 30 y.o. G13P1001 female here for a routine annual gynecologic exam.  Current complaints: none. Menses 5 days long with 28-30 day interval. Moderate flow, no cramping.  Denies abnormal vaginal bleeding, discharge, pelvic pain, problems with intercourse or other gynecologic concerns.    Gynecologic History Patient's last menstrual period was 05/29/2021. Patient is sexually active  Contraception: condoms Last Pap: 3 years ago. Results were: normal Last mammogram: n/a.  Colorectal Cancer Screening: n/a.   Obstetric History OB History  Gravida Para Term Preterm AB Living  1 1 1     1   SAB IAB Ectopic Multiple Live Births        0 1    # Outcome Date GA Lbr Len/2nd Weight Sex Delivery Anes PTL Lv  1 Term 04/19/20 [redacted]w[redacted]d  5 lb 5.5 oz (2.425 kg) M CS-LTranv EPI  LIV    Past Medical History:  Diagnosis Date   Gestational diabetes    not diabetic. last a1c prediabetic range.   Uterine fibroid     Past Surgical History:  Procedure Laterality Date   CESAREAN SECTION N/A 04/19/2020   Procedure: CESAREAN SECTION;  Surgeon: Osborne Oman, MD;  Location: [facility];  Service: Obstetrics;  Laterality: N/A;   NO PAST SURGERIES      Current Outpatient Medications on File Prior to Visit  Medication Sig Dispense Refill   Multiple Vitamin (MULTIVITAMIN) capsule Take 1 capsule by mouth daily.     azelastine (ASTELIN) 0.1 % nasal spray Place 2 sprays into both nostrils 2 (two) times daily. Use in each nostril as directed 30 mL 12   Azelastine HCl 137 MCG/SPRAY SOLN PLACE 2 SPRAYS INTO BOTH NOSTRILS 2 (TWO) TIMES DAILY. USE IN EACH NOSTRIL AS DIRECTED 30 mL 12   benzonatate (TESSALON) 100 MG capsule TAKE 1 CAPSULE (100 MG TOTAL) BY MOUTH 3 (THREE) TIMES DAILY AS NEEDED. 30 capsule 0   coconut oil OIL Apply 1 application topically as needed (nipple pain). (Patient not taking: Reported on 06/13/2021)  0    hydrocortisone (ANUSOL-HC) 25 MG suppository PLACE 1 SUPPOSITORY RECTALLY 2 TIMES DAILY 12 suppository 0   medroxyPROGESTERone (PROVERA) 10 MG tablet TAKE 1 TABLET (10 MG TOTAL) BY MOUTH DAILY. 60 tablet 1   Prenatal 27-1 MG TABS TAKE 1 TABLET BY MOUTH DAILY AT 12 NOON. (Patient not taking: Reported on 06/13/2021) 30 tablet 11   Prenatal Vit-Fe Fumarate-FA (PRENATAL VITAMIN PLUS LOW IRON) 27-1 MG TABS TAKE 1 TABLET BY MOUTH ONCE DAILY AT 12 NOON (Patient not taking: Reported on 06/13/2021) 90 tablet 3   prenatal vitamin w/FE, FA (PRENATAL 1 + 1) 27-1 MG TABS tablet Take 1 tablet by mouth daily at 12 noon. (Patient not taking: Reported on 06/13/2021) 90 tablet 3   No current facility-administered medications on file prior to visit.    No Known Allergies  Social History   Socioeconomic History   Marital status: Married    Spouse name: Not on file   Number of children: Not on file   Years of education: Not on file   Highest education level: Not on file  Occupational History   Occupation: nurse  Tobacco Use   Smoking status: Never   Smokeless tobacco: Never  Vaping Use   Vaping Use: Never used  Substance and Sexual Activity   Alcohol use: Never   Drug use: Never   Sexual activity: Yes  Birth control/protection: None  Other Topics Concern   Not on file  Social History Narrative   Not on file   Social Determinants of Health   Financial Resource Strain: Not on file  Food Insecurity: Not on file  Transportation Needs: Not on file  Physical Activity: Not on file  Stress: Not on file  Social Connections: Not on file  Intimate Partner Violence: Not on file    No family history on file.  The following portions of the patient's history were reviewed and updated as appropriate: allergies, current medications, past family history, past medical history, past social history, past surgical history and problem list.  Review of Systems Pertinent items are noted in HPI.    Objective:  BP 105/62   Pulse 78   Ht 4\' 11"  (1.499 m)   Wt 117 lb (53.1 kg)   LMP 05/29/2021   Breastfeeding No   BMI 23.63 kg/m  Wt Readings from Last 3 Encounters:  06/13/21 117 lb (53.1 kg)  11/28/20 113 lb (51.3 kg)  08/15/20 117 lb 6.4 oz (53.3 kg)     Chaperone present during exam  CONSTITUTIONAL: Well-developed, well-nourished female in no acute distress.  HENT:  Normocephalic, atraumatic, External right and left ear normal. Oropharynx is clear and moist EYES: Conjunctivae and EOM are normal. Pupils are equal, round, and reactive to light. No scleral icterus.  NECK: Normal range of motion, supple, no masses.  Normal thyroid.   CARDIOVASCULAR: Normal heart rate noted, regular rhythm RESPIRATORY: Clear to auscultation bilaterally. Effort and breath sounds normal, no problems with respiration noted. BREASTS: Symmetric in size. No masses, skin changes, nipple drainage, or lymphadenopathy. ABDOMEN: Soft, normal bowel sounds, no distention noted.  No tenderness, rebound or guarding.  PELVIC: Normal appearing external genitalia; normal appearing vaginal mucosa and cervix.  No abnormal discharge noted.  MUSCULOSKELETAL: Normal range of motion. No tenderness.  No cyanosis, clubbing, or edema.  2+ distal pulses. SKIN: Skin is warm and dry. No rash noted. Not diaphoretic. No erythema. No pallor. NEUROLOGIC: Alert and oriented to person, place, and time. Normal reflexes, muscle tone coordination. No cranial nerve deficit noted. PSYCHIATRIC: Normal mood and affect. Normal behavior. Normal judgment and thought content.  Assessment:  Annual gynecologic examination with pap smear   Plan:  1. Well Woman Exam Will follow up results of pap smear and manage accordingly. STD testing discussed. Patient requested vaginal testing   Routine preventative health maintenance measures emphasized. Please refer to After Visit Summary for other counseling recommendations.    Loma Boston,  [facility] [employer]

## 2021-06-14 ENCOUNTER — Ambulatory Visit
Admission: EM | Admit: 2021-06-14 | Discharge: 2021-06-14 | Disposition: A | Discharge status: Home / Self Care | Payer: 59 | Attending: Emergency Medicine | Admitting: Emergency Medicine

## 2021-06-14 ENCOUNTER — Other Ambulatory Visit: Payer: Self-pay

## 2021-06-14 ENCOUNTER — Encounter: Payer: Self-pay | Admitting: Medical

## 2021-06-14 DIAGNOSIS — J00 Acute nasopharyngitis [common cold]: Secondary | ICD-10-CM

## 2021-06-14 DIAGNOSIS — R051 Acute cough: Secondary | ICD-10-CM | POA: Diagnosis not present

## 2021-06-14 DIAGNOSIS — J069 Acute upper respiratory infection, unspecified: Secondary | ICD-10-CM

## 2021-06-14 LAB — CYTOLOGY - PAP
Chlamydia: NEGATIVE
Comment: NEGATIVE
Comment: NEGATIVE
Comment: NORMAL
Diagnosis: NEGATIVE
High risk HPV: NEGATIVE
Neisseria Gonorrhea: NEGATIVE

## 2021-06-14 MED ORDER — IPRATROPIUM BROMIDE 0.03 % NA SOLN: 2.0000 | Freq: Two times a day (BID) | NASAL | 0 refills | Status: DC

## 2021-06-14 NOTE — ED Triage Notes (Signed)
Pt reports of cough that started a week ago.

## 2021-06-14 NOTE — Discharge Instructions (Addendum)
AndYour symptoms are most consistent with a viral upper respiratory illness.  Conservative care is recommended at this time.  This includes rest, pushing clear fluids and activity as tolerated.  You may also noticed that your child's appetite is reduced, this is okay as long as they are drinking plenty of clear fluids.  I have also provided you with a prescription for Atrovent nasal spray, please spray 2 sprays in each nostril every 12 hours.  Acetaminophen (Tylenol): This is a good fever reducer.  If there body temperature rises above 101.5 as measured with a thermometer, it is recommended that you give them 1,000 mg every 6-8 hours until they are temperature falls below 101.5, please not take more than 3,000 mg of acetaminophen either as a separate medication or as in ingredient in an over-the-counter cold/flu preparation within a 24-hour period  Ibuprofen  (Advil, Motrin): This is a good anti-inflammatory medication which addresses aches and pains and, to some degree, congestion in the nasal passages.  I recommend giving between 400 to 600 mg every 6-8 hours as needed.  Pseudoephedrine (Sudafed): This is a decongestant.  This medication has to be purchased from the pharmacist counter, I recommend giving 2 tablets, 60 mg, 2-3 times a day as needed to relieve runny nose and sinus drainage.  Guaifenesin (Robitussin, Mucinex): This is an expectorant.  This helps break up chest congestion and loosen up thick nasal drainage making phlegm and drainage more liquid and therefore easier to remove.  I recommend being 400 mg three times daily as needed.  Dextromethorphan (any cough medicine with the letters "DM" added to it's name such as Robitussin DM): This is a cough suppressant.  This is often recommended to be taken at nighttime to suppress cough and help children sleep.  Give dosage as directed on the bottle.   Chloraseptic Throat Spray: Spray 5 sprays into affected area every 2 hours, hold for 15 seconds  and either swallow or spit it out.  This is a excellent numbing medication because it is a spray, you can put it right where you needed and so sucking on a lozenge and numbing your entire mouth.  Viral testing was performed today for:  COVID Influenza RSV  Please continue to isolate at home until you receive the results of this. The results will be made available through your MyChart account.  If there are abnormal findings, you will be contacted by phone and if any treatment is needed, that will be provided to you as well.  This test result typically takes 24 to 48 hours to complete .      It is recommend that he/she/you should self isolate at home for at least 3 days after your symptoms began AND he/she/you is/are fever free and symptom free without the use of fever reducing medications such as Tylenol and ibuprofen.    Please also keep in mind is also important to self isolate within your household and to wear a mask around other people at home on days 4 through 10 after your symptoms began.  For more information, please read to the [facility] website regarding COVID, influenza and RSV isolation guidelines.    Based on my physical exam findings and the history provided  today, I do not see any evidence of bacterial infection therefore treatment with antibiotics would be of no benefit.  Please follow-up within the next 3 to 5 days either with your primary care provider or urgent care if their symptoms do not resolve.  If  you do not have a primary care provider, we will assist you in finding one.

## 2021-06-14 NOTE — ED Provider Notes (Signed)
[place]    CSN: [id number] Arrival date & time: 06/14/21  1503   History   Chief Complaint No chief complaint on file.  HPI Crystal Joseph is a 30 y.o. female. Patient complains of a cough that started about a week ago.  Patient's vital signs are stable on exam today.  Patient states he is also having some nasal congestion and runny nose.  Patient denies sore throat, shortness of breath, ear pain, headache, nausea, vomiting, diarrhea.  Patient states she has tried some over-the-counter Mucinex and initially felt that it was helping but the last 24 hours feels that she has not made enough progress, patient is asking to be evaluated today.  The history is provided by the patient.   Past Medical History:  Diagnosis Date   Gestational diabetes    not diabetic. last a1c prediabetic range.   Uterine fibroid    Patient Active Problem List   Diagnosis Date Noted   Hemorrhoids 10/08/2015   Esophageal reflux 02/21/2013   Anxiety state 01/08/2013   Past Surgical History:  Procedure Laterality Date   CESAREAN SECTION N/A 04/19/2020   Procedure: CESAREAN SECTION;  Surgeon: Osborne Oman, MD;  Location: [facility] LD ORS;  Service: Obstetrics;  Laterality: N/A;   NO PAST SURGERIES     OB History     Gravida  1   Para  1   Term  1   Preterm      AB      Living  1      SAB      IAB      Ectopic      Multiple  0   Live Births  1          Home Medications    Prior to Admission medications   Medication Sig Start Date End Date Taking? Authorizing Provider  ipratropium (ATROVENT) 0.03 % nasal spray Place 2 sprays into both nostrils every 12 (twelve) hours. 06/14/21  Yes Lynden Oxford Scales, PA-C  Multiple Vitamin (MULTIVITAMIN) capsule Take 1 capsule by mouth daily.    [provider]   Family History History reviewed. No pertinent family history. Social History Social History   Tobacco Use   Smoking status: Never   Smokeless tobacco: Never   Vaping Use   Vaping Use: Never used  Substance Use Topics   Alcohol use: Never   Drug use: Never   Allergies   Patient has no known allergies.  Review of Systems Review of Systems Pertinent findings noted in history of present illness.   Physical Exam Triage Vital Signs ED Triage Vitals  Enc Vitals Group     BP 05/31/21 0827 (!) 147/82     Pulse Rate 05/31/21 0827 72     Resp 05/31/21 0827 18     Temp 05/31/21 0827 98.3 F (36.8 C)     Temp Source 05/31/21 0827 Oral     SpO2 05/31/21 0827 98 %     Weight --      Height --      Head Circumference --      Peak Flow --      Pain Score 05/31/21 0826 5     Pain Loc --      Pain Edu? --      Excl. in [place]? --    No data found.  Updated Vital Signs BP 129/85 (BP Location: Right Arm)   Pulse 83   Temp 98.2 F (36.8 C) (Oral)  Resp 20   LMP 05/29/2021   SpO2 98%   Visual Acuity Right Eye Distance:   Left Eye Distance:   Bilateral Distance:    Right Eye Near:   Left Eye Near:    Bilateral Near:     Physical Exam Vitals and nursing note reviewed.  Constitutional:      General: She is not in acute distress.    Appearance: Normal appearance. She is not ill-appearing.  HENT:     Head: Normocephalic and atraumatic.     Salivary Glands: Right salivary gland is not diffusely enlarged or tender. Left salivary gland is not diffusely enlarged or tender.     Right Ear: Tympanic membrane, ear canal and external ear normal. No drainage. No middle ear effusion. There is no impacted cerumen. Tympanic membrane is not erythematous or bulging.     Left Ear: Tympanic membrane, ear canal and external ear normal. No drainage.  No middle ear effusion. There is no impacted cerumen. Tympanic membrane is not erythematous or bulging.     Nose: Mucosal edema, congestion and rhinorrhea present. No nasal deformity or septal deviation. Rhinorrhea is clear.     Right Turbinates: Not enlarged, swollen or pale.     Left Turbinates: Not  enlarged, swollen or pale.     Right Sinus: No maxillary sinus tenderness or frontal sinus tenderness.     Left Sinus: No maxillary sinus tenderness or frontal sinus tenderness.     Mouth/Throat:     Lips: Pink. No lesions.     Mouth: Mucous membranes are moist. No oral lesions.     Pharynx: Oropharynx is clear. Uvula midline. No posterior oropharyngeal erythema or uvula swelling.     Tonsils: No tonsillar exudate. 0 on the right. 0 on the left.  Eyes:     General: Lids are normal.        Right eye: No discharge.        Left eye: No discharge.     Extraocular Movements: Extraocular movements intact.     Conjunctiva/sclera: Conjunctivae normal.     Right eye: Right conjunctiva is not injected.     Left eye: Left conjunctiva is not injected.  Neck:     Trachea: Trachea and phonation normal.  Cardiovascular:     Rate and Rhythm: Normal rate and regular rhythm.     Pulses: Normal pulses.     Heart sounds: Normal heart sounds. No murmur heard.   No friction rub. No gallop.  Pulmonary:     Effort: Pulmonary effort is normal. No accessory muscle usage, prolonged expiration or respiratory distress.     Breath sounds: Normal breath sounds. No stridor, decreased air movement or transmitted upper airway sounds. No decreased breath sounds, wheezing, rhonchi or rales.  Chest:     Chest wall: No tenderness.  Musculoskeletal:        General: Normal range of motion.     Cervical back: Normal range of motion and neck supple. Normal range of motion.  Lymphadenopathy:     Cervical: No cervical adenopathy.  Skin:    General: Skin is warm and dry.     Findings: No erythema or rash.  Neurological:     General: No focal deficit present.     Mental Status: She is alert and oriented to person, place, and time.  Psychiatric:        Mood and Affect: Mood normal.        Behavior: Behavior normal.   UC Treatments /  Results  Labs (all labs ordered are listed, but only abnormal results are  displayed)  Labs Reviewed  COVID-19, FLU A+B AND RSV    EKG  Radiology No results found.  Procedures Procedures (including critical care time)  Medications Ordered in UC Medications - No data to display  Initial Impression / Assessment and Plan / UC Course  I have reviewed the triage vital signs and the nursing notes.  Pertinent labs & imaging results that were available during my care of the patient were reviewed by me and considered in my medical decision making (see chart for details).      Physical exam is unremarkable other than some congestion and clear rhinorrhea.  Recommend patient begin Atrovent to help dry mucous membranes, patient advised that this will also resolve her cough.  Viral testing was performed as requested by patient.  Final Clinical Impressions(s) / UC Diagnoses   Final diagnoses:  Acute cough  Acute upper respiratory infection  Acute rhinitis     Discharge Instructions      AndYour symptoms are most consistent with a viral upper respiratory illness.  Conservative care is recommended at this time.  This includes rest, pushing clear fluids and activity as tolerated.  You may also noticed that your child's appetite is reduced, this is okay as long as they are drinking plenty of clear fluids.  I have also provided you with a prescription for Atrovent nasal spray, please spray 2 sprays in each nostril every 12 hours.  Acetaminophen (Tylenol): This is a good fever reducer.  If there body temperature rises above 101.5 as measured with a thermometer, it is recommended that you give them 1,000 mg every 6-8 hours until they are temperature falls below 101.5, please not take more than 3,000 mg of acetaminophen either as a separate medication or as in ingredient in an over-the-counter cold/flu preparation within a 24-hour period  Ibuprofen  (Advil, Motrin): This is a good anti-inflammatory medication which addresses aches and pains and, to some degree, congestion  in the nasal passages.  I recommend giving between 400 to 600 mg every 6-8 hours as needed.  Pseudoephedrine (Sudafed): This is a decongestant.  This medication has to be purchased from the pharmacist counter, I recommend giving 2 tablets, 60 mg, 2-3 times a day as needed to relieve runny nose and sinus drainage.  Guaifenesin (Robitussin, Mucinex): This is an expectorant.  This helps break up chest congestion and loosen up thick nasal drainage making phlegm and drainage more liquid and therefore easier to remove.  I recommend being 400 mg three times daily as needed.  Dextromethorphan (any cough medicine with the letters "DM" added to it's name such as Robitussin DM): This is a cough suppressant.  This is often recommended to be taken at nighttime to suppress cough and help children sleep.  Give dosage as directed on the bottle.   Chloraseptic Throat Spray: Spray 5 sprays into affected area every 2 hours, hold for 15 seconds and either swallow or spit it out.  This is a excellent numbing medication because it is a spray, you can put it right where you needed and so sucking on a lozenge and numbing your entire mouth.  Viral testing was performed today for:  COVID Influenza RSV  Please continue to isolate at home until you receive the results of this. The results will be made available through your MyChart account.  If there are abnormal findings, you will be contacted by phone and if any treatment  is needed, that will be provided to you as well.  This test result typically takes 24 to 48 hours to complete .      It is recommend that he/she/you should self isolate at home for at least 3 days after your symptoms began AND he/she/you is/are fever free and symptom free without the use of fever reducing medications such as Tylenol and ibuprofen.    Please also keep in mind is also important to self isolate within your household and to wear a mask around other people at home on days 4 through 10 after  your symptoms began.  For more information, please read to the [facility] website regarding COVID, influenza and RSV isolation guidelines.    Based on my physical exam findings and the history provided  today, I do not see any evidence of bacterial infection therefore treatment with antibiotics would be of no benefit.  Please follow-up within the next 3 to 5 days either with your primary care provider or urgent care if their symptoms do not resolve.  If you do not have a primary care provider, we will assist you in finding one.      ED Prescriptions     Medication Sig Dispense Auth. Provider   ipratropium (ATROVENT) 0.03 % nasal spray Place 2 sprays into both nostrils every 12 (twelve) hours. 30 mL Lynden Oxford Scales, PA-C      PDMP not reviewed this encounter.  Disposition Upon Discharge:  Patient presented with an acute illness with associated systemic symptoms and significant discomfort requiring urgent management. In my opinion, this is a condition that a prudent lay person (someone who possesses an average knowledge of health and medicine) may potentially expect to result in complications if not addressed urgently such as respiratory distress, impairment of bodily function or dysfunction of bodily organs.   Routine symptom specific, illness specific and/or disease specific instructions were discussed with the patient and/or caregiver at length.   As such, the patient has been evaluated and assessed, work-up was performed and treatment was provided in alignment with urgent care protocols and evidence based medicine.  Patient/parent/caregiver has been advised that the patient may require follow up for further testing and treatment if the symptoms continue in spite of treatment, as clinically indicated and appropriate.  Patient was tested for COVID-19, Influenza and/or RSV, the patient/parent/guardian was advised to isolate at home pending the results of his/her diagnostic coronavirus test  and potentially longer if they're positive. Advised pt that if his/her COVID-19 test returns positive, it's recommended to self-isolate for at least 10 days after symptoms first appeared AND until fever-free for 24 hours without fever reducer AND other symptoms have improved or resolved. Discussed self-isolation recommendations as well as instructions for household member/close contacts as per the [facility] and [place] [facility], and also gave patient the [facility] packet with this information.  Patient/parent/caregiver has been advised to return to the [facility] or PCP in 3-5 days if no better; to PCP or the Emergency Department if new signs and symptoms develop, or if the current signs or symptoms continue to change or worsen for further workup, evaluation and treatment as clinically indicated and appropriate  The patient will follow up with their current PCP if and as advised. If the patient does not currently have a PCP we will assist them in obtaining one.   Patient/parent/caregiver verbalized understanding and agreement of plan as discussed.  All questions were addressed during visit.  Please see discharge instructions below for further details of  plan.  Condition: stable for discharge home Home: take medications as prescribed; routine discharge instructions as discussed; follow up as advised.    Lynden Oxford Scales, [place] 06/15/21 [phone]

## 2021-06-16 LAB — COVID-19, FLU A+B AND RSV
Influenza A, NAA: NOT DETECTED
Influenza B, NAA: NOT DETECTED
RSV, NAA: NOT DETECTED
SARS-CoV-2, NAA: NOT DETECTED

## 2021-07-13 ENCOUNTER — Encounter: Payer: Self-pay | Admitting: Family Medicine

## 2021-07-17 ENCOUNTER — Encounter: Payer: Self-pay | Admitting: Family Medicine

## 2021-07-17 ENCOUNTER — Other Ambulatory Visit: Payer: Self-pay

## 2021-07-17 ENCOUNTER — Ambulatory Visit: Payer: 59 | Admitting: Family Medicine

## 2021-07-17 VITALS — BP 112/86 | HR 85 | Wt 115.0 lb

## 2021-07-17 DIAGNOSIS — N6341 Unspecified lump in right breast, subareolar: Secondary | ICD-10-CM

## 2021-07-17 NOTE — Progress Notes (Signed)
Patient reports lump on right side lower quadrant. Kathrene Alu RN

## 2021-07-17 NOTE — Progress Notes (Signed)
Subjective:    Patient ID: Crystal Joseph, female    DOB: 1991-01-07, 30 y.o.   MRN: [id number]  Crystal Joseph is a 30 y/o pt who presents with concerns of a newly noticed small breast lump in her R breast under her areola. The pt notes that she has been able to feel a small lump, somewhat larger than a pea, since Saturday, 07/13/21. She has never felt a breast lump before. She denies the lump changing in character over the last 5 days. She denies any bloody discharge. She does endorse some intermittent tenderness a few times a day but denies overt pain. She denies any skin changes or dimpling. Her LMP was one week ago. She denies fevers, chills, night sweats, or unexpected weight loss. She denies noticing any other lumps or bumps in either breast, armpits or neck. She denies any family history of breast or ovarian cancers.  She also notes that she has been able to express a few drops of milk from both nipples over the last few days. She stopped breastfeeding her son, now 76 months old, back in August. She does not take any medications regularly nor supplements besides a daily multivitamin. She denies any constipation, irregular periods, vision changes or new/different headaches.     Review of Systems  Constitutional:  Negative for appetite change, chills, fever and unexpected weight change.  Respiratory:  Negative for chest tightness and shortness of breath.   Cardiovascular:  Negative for chest pain.  Gastrointestinal:  Negative for constipation.  Genitourinary:  Negative for menstrual problem and vaginal discharge.  Skin:  Negative for color change and rash.  Neurological:  Negative for dizziness and headaches.  Hematological:  Negative for adenopathy.      Objective:   Physical Exam Exam conducted with a chaperone present.  Constitutional:      Appearance: Normal appearance.  HENT:     Head: Normocephalic and atraumatic.  Eyes:     Extraocular Movements: Extraocular movements intact.      Pupils: Pupils are equal, round, and reactive to light.  Cardiovascular:     Rate and Rhythm: Normal rate and regular rhythm.     Pulses: Normal pulses.     Heart sounds: Normal heart sounds.  Pulmonary:     Effort: Pulmonary effort is normal.     Breath sounds: Normal breath sounds.  Chest:  Breasts:    Right: No swelling, bleeding, nipple discharge, skin change or tenderness.     Left: Normal.     Comments: Small, firm, round, mobile nodule just inferior to areola.  Abdominal:     Palpations: Abdomen is soft.     Tenderness: There is no guarding.  Skin:    General: Skin is warm and dry.     Findings: No lesion.  Neurological:     Mental Status: She is alert.          Assessment & Plan:    This is a 30 y/o otherwise healthy woman who recently stopped breastfeeding four months ago. Feel that her small amount of milk production with expression is related to normal physiologic lactation from recent breastfeeding, especially in the absence of clinical signs/symptoms of hypothyroidism or prolactinoma, and in the absence of any dopaminergic medications.  Additionally, feel that patient's presentation of a roughly 0.5cm firm nodule is benign and likely a fibroadenoma vs normal lobule. No red flag symptoms or signs. Discussed conservative management with repeat examination in 6 months vs mammogram now. Pt preference is for  mammogram now.   Plan:  Right Breast nodule -Mammogram as per pt preference  2.   Physiologic lactation -Reassurance provided

## 2021-07-18 NOTE — Progress Notes (Signed)
° °  Subjective:    Patient ID: Crystal Joseph, female    DOB: 08-Mar-1991, 30 y.o.   MRN: [id number]  HPI Patient seen for breast lump that started for 5 days ago that is subareolar in the lower region.  She noticed this 5 days ago and is a little larger than a pea in size.  It is mobile and nontender to palpation, although she gets an occasional pain in her breast from time to time that started since she noticed the breast lump.  No increase in size since the interval.  Denies skin changes, erythema, bloody nipple discharge.  She does notice some galactorrhea.  She stopped breast-feeding approximately 4 months ago and has not started any new medications.  Denies headaches, vision changes.  It is bilateral.   Review of Systems     Objective:   Physical Exam Vitals reviewed.  Constitutional:      Appearance: Normal appearance.  Chest:    Neurological:     Mental Status: She is alert.       Assessment & Plan:  1. Subareolar mass of right breast This feels benign in nature.  I discussed close surveillance in the next 4 to 6 months versus obtaining a mammogram.  Patient would like a diagnostic mammogram, which we will arrange.    The galactorrhea is probably physiologic from cessation of breast-feeding 4 months ago -galactorrhea can continue after breast-feeding for up to a year without concern.  The nature of the galactorrhea is bilateral, which is also reassuring. - MM Digital Diagnostic Unilat R; Future

## 2021-07-19 ENCOUNTER — Other Ambulatory Visit: Payer: Self-pay | Admitting: Family Medicine

## 2021-07-19 DIAGNOSIS — N6341 Unspecified lump in right breast, subareolar: Secondary | ICD-10-CM

## 2021-08-01 IMAGING — US US MFM FETAL BPP W/O NON-STRESS
1 series · 15 of 28 positions shown · non-contrast
Comparison: none

[Series 1: us mfm fetal bpp w/o non-stress · 28 acquisitions, 15 frames shown]
[im 1/28]
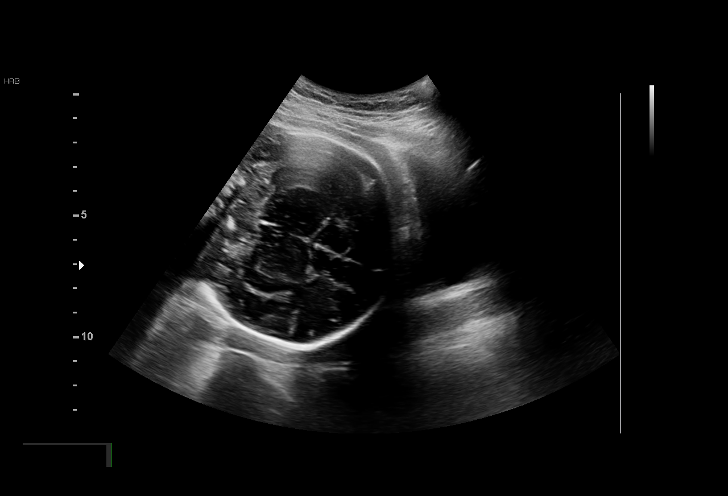
[im 3/28]
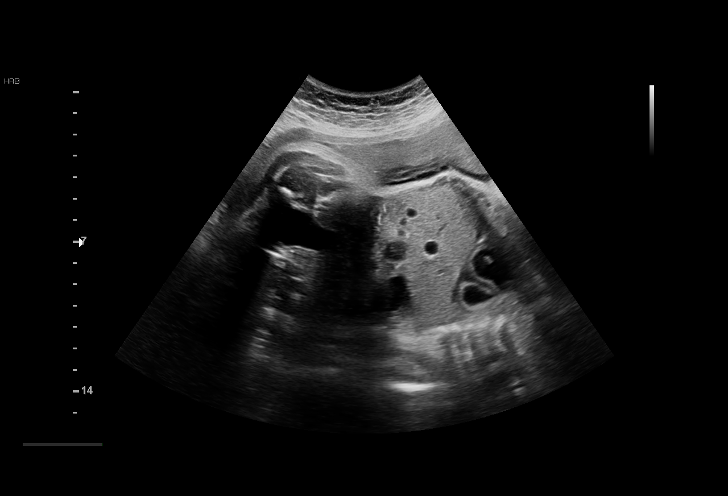
[im 5/28]
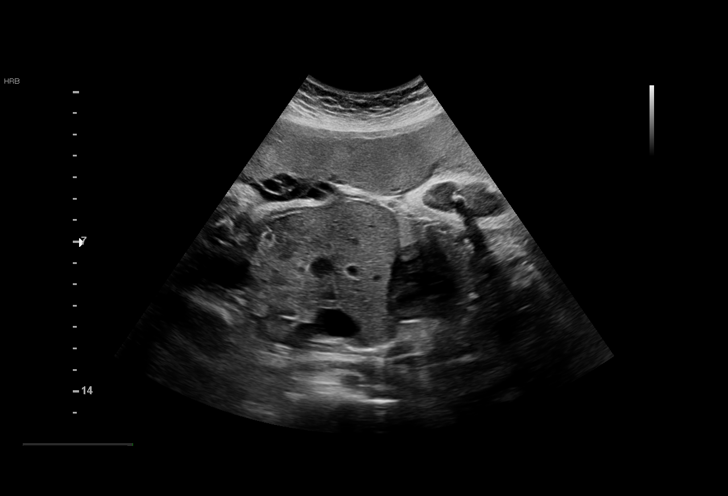
[im 7/28]
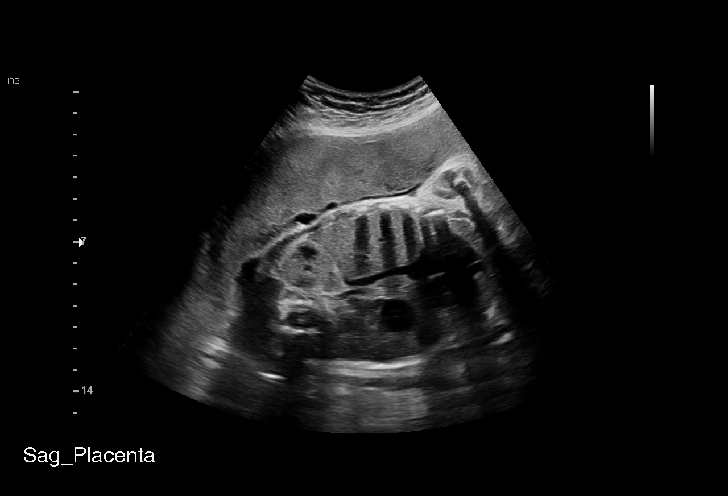
[im 9/28]
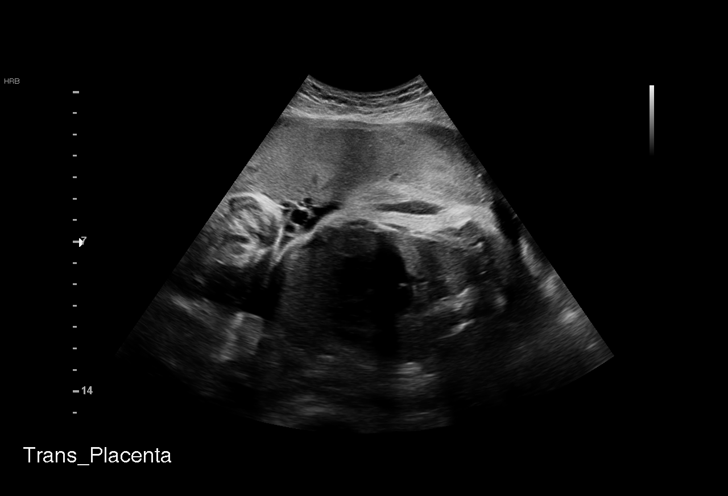
[im 11/28]
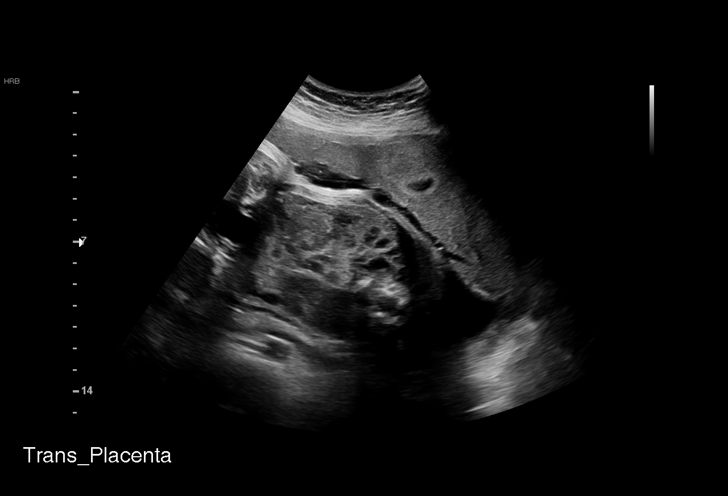
[im 13/28]
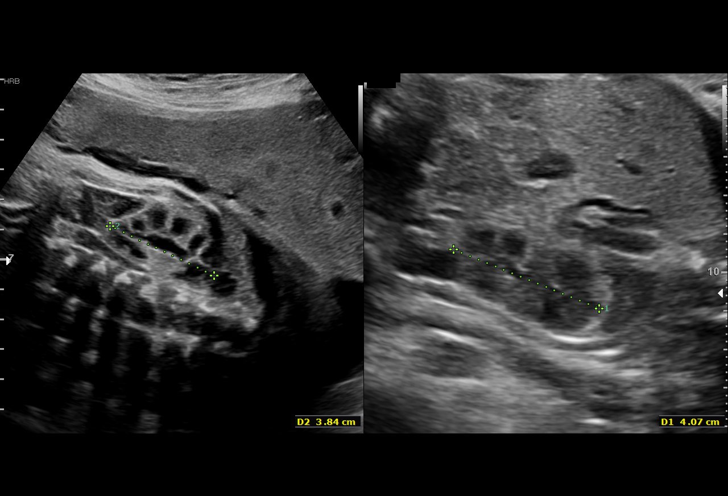
[im 15/28]
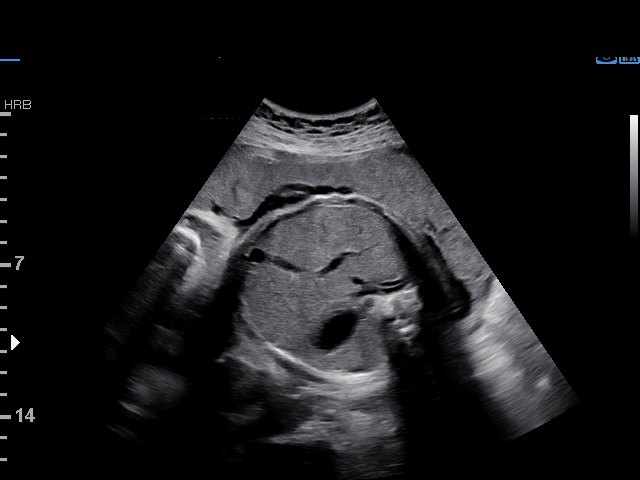
[im 16/28]
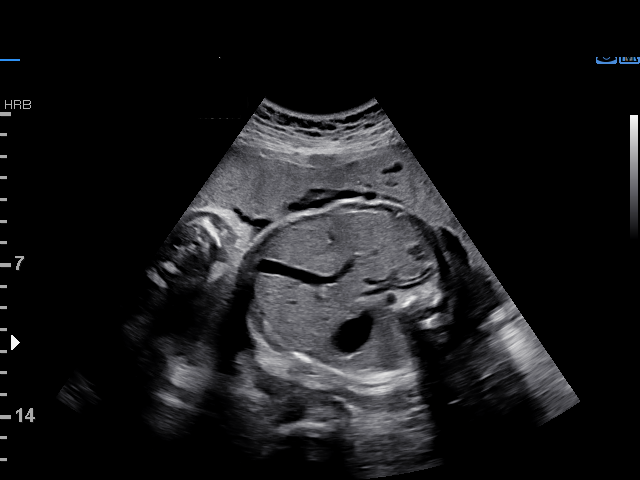
[im 18/28]
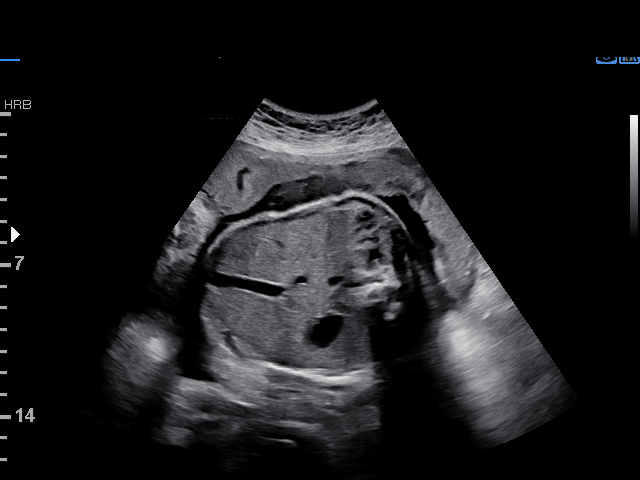
[im 20/28]
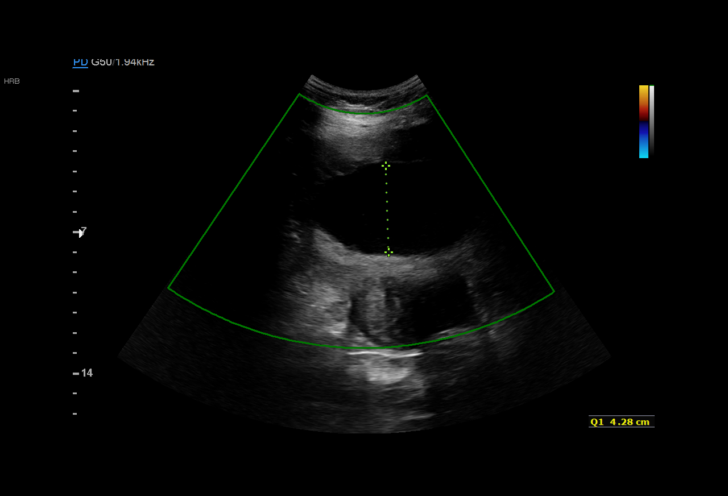
[im 22/28]
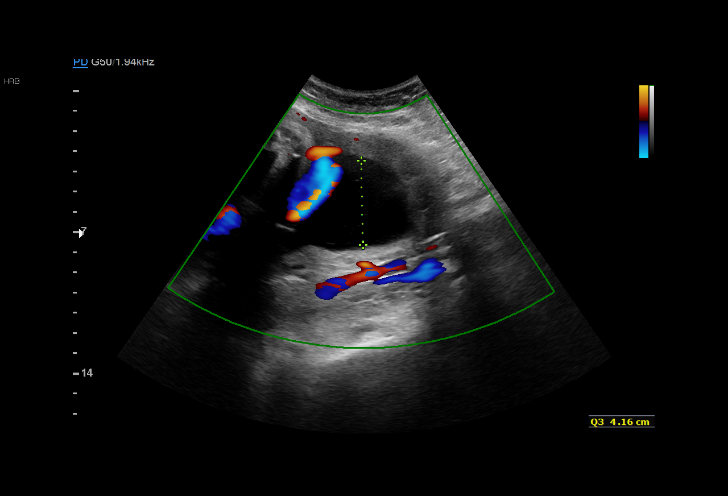
[im 24/28]
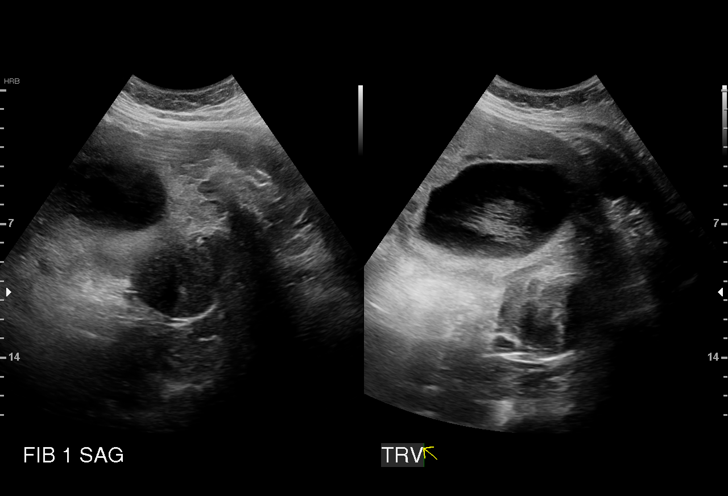
[im 26/28]
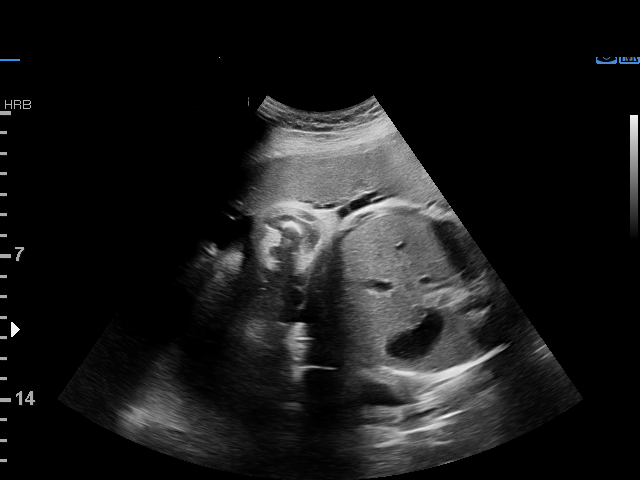
[im 28/28]
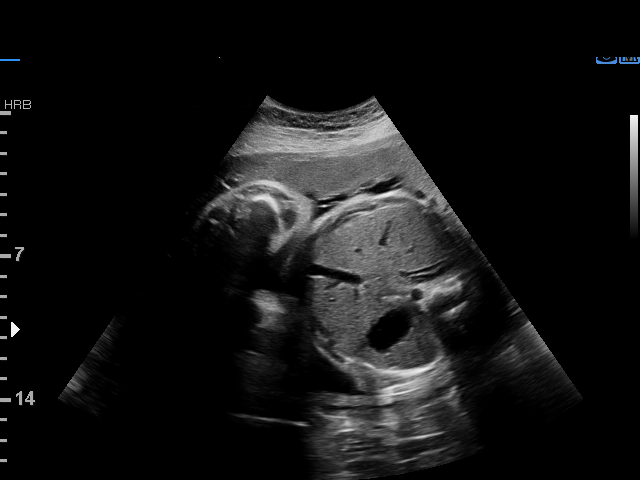

[15 of 28 positions shown; findings below may reference images not displayed]

Indications

 Gestational diabetes in pregnancy,
 controlled by oral hypoglycemic drugs
 (metformin)
 Encounter for other antenatal screening
 follow-up (Neg AFP)
 35 weeks gestation of pregnancy
Vital Signs

 BMI:
Fetal Evaluation

 Num Of Fetuses:         1
 Fetal Heart Rate(bpm):  130
 Cardiac Activity:       Observed
 Presentation:           Cephalic
 Placenta:               Anterior
 P. Cord Insertion:      Previously Visualized

 Amniotic Fluid
 AFI FV:      Within normal limits

 AFI Sum(cm)     %Tile       Largest Pocket(cm)
 16.06           59

 RUQ(cm)       RLQ(cm)       LUQ(cm)        LLQ(cm)

Biophysical Evaluation

 Amniotic F.V:   Within normal limits       F. Tone:        Observed
 F. Movement:    Observed                   Score:          [DATE]
 F. Breathing:   Observed
OB History

 Gravidity:    1         Term:   0        Prem:   0        SAB:   0
 TOP:          0       Ectopic:  0        Living: 0
Gestational Age

 LMP:           35w 3d        Date:  07/26/19                 EDD:   05/01/20
 Best:          35w 3d     Det. By:  LMP  (07/26/19)          EDD:   05/01/20
Anatomy

 Thoracic:              Appears normal         Kidneys:                Appear normal
 Diaphragm:             Appears normal         Bladder:                Appears normal
 Stomach:               Appears normal, left
                        sided
Cervix Uterus Adnexa

 Cervix
 Not visualized (advanced GA >39wks)
Impression

 Gestational diabetes.  Patient takes Metformin 500 mg twice
 daily.  She reports her fasting levels are better controlled
 now.  Blood pressure today at our office is 100 x 68 mmHg.

 Amniotic fluid is normal and good fetal activity is seen
 .Antenatal testing is reassuring. BPP [DATE].

 We reassured the patient of the findings.
Recommendations

 -Continue weekly BPP till delivery.
                 [place], Danyel

## 2021-08-06 ENCOUNTER — Other Ambulatory Visit (HOSPITAL_BASED_OUTPATIENT_CLINIC_OR_DEPARTMENT_OTHER): Payer: Self-pay

## 2021-08-06 ENCOUNTER — Encounter: Payer: Self-pay | Admitting: Family

## 2021-08-06 ENCOUNTER — Ambulatory Visit (INDEPENDENT_AMBULATORY_CARE_PROVIDER_SITE_OTHER): Payer: No Typology Code available for payment source | Admitting: Family

## 2021-08-06 VITALS — BP 100/60 | HR 86 | Temp 98.0°F | Ht 59.0 in | Wt 118.8 lb

## 2021-08-06 DIAGNOSIS — R300 Dysuria: Secondary | ICD-10-CM

## 2021-08-06 LAB — POCT URINALYSIS DIP (MANUAL ENTRY)
Bilirubin, UA: NEGATIVE
Glucose, UA: NEGATIVE mg/dL
Ketones, POC UA: NEGATIVE mg/dL
Nitrite, UA: NEGATIVE
Protein Ur, POC: NEGATIVE mg/dL
Spec Grav, UA: 1.02 (ref 1.010–1.025)
Urobilinogen, UA: 0.2 E.U./dL
pH, UA: 6 (ref 5.0–8.0)

## 2021-08-06 MED ORDER — SULFAMETHOXAZOLE-TRIMETHOPRIM 800-160 MG PO TABS
1.0000 | ORAL_TABLET | Freq: Two times a day (BID) | ORAL | 0 refills | Status: DC
  Filled 2021-08-06: qty 10, 5d supply, fill #0

## 2021-08-06 NOTE — Progress Notes (Signed)
°  Crystal Joseph is a 31 y.o. female with the following history as recorded in EpicCare:  Patient Active Problem List   Diagnosis Date Noted   Hemorrhoids 10/08/2015   Esophageal reflux 02/21/2013   Anxiety state 01/08/2013    Current Outpatient Medications  Medication Sig Dispense Refill   Multiple Vitamin (MULTIVITAMIN) capsule Take 1 capsule by mouth daily.     sulfamethoxazole-trimethoprim (BACTRIM DS) 800-160 MG tablet Take 1 tablet by mouth 2 (two) times daily. 10 tablet 0   No current facility-administered medications for this visit.    Allergies: Patient has no known allergies.  Past Medical History:  Diagnosis Date   Gestational diabetes    not diabetic. last a1c prediabetic range.   Uterine fibroid     Past Surgical History:  Procedure Laterality Date   CESAREAN SECTION N/A 04/19/2020   Procedure: CESAREAN SECTION;  Surgeon: Osborne Oman, MD;  Location: [facility] LD ORS;  Service: Obstetrics;  Laterality: N/A;    No family history on file.  Social History   Tobacco Use   Smoking status: Never   Smokeless tobacco: Never  Substance Use Topics   Alcohol use: Never    Subjective:  Concerns for possible UTI; started yesterday with burning on urination/ pelvic pressure; did see some blood noted in urine; incomplete sense of emptying;  LMP 07/23/2021     Objective:  Vitals:   08/06/21 0944  BP: 100/60  Pulse: 86  Temp: 98 F (36.7 C)  TempSrc: Oral  SpO2: 98%  Weight: 118 lb 12.8 oz (53.9 kg)  Height: 4\' 11"  (1.499 m)    General: Well developed, well nourished, in no acute distress  Skin : Warm and dry.  Head: Normocephalic and atraumatic  Lungs: Respirations unlabored; clear to auscultation bilaterally without wheeze, rales, rhonchi  CVS exam: normal rate and regular rhythm.  Neurologic: Alert and oriented; speech intact; face symmetrical; moves all extremities well; CNII-XII intact without focal deficit   Assessment:  1. Dysuria     Plan:  Suspect UTI;  check U/A and urine culture; Rx for Bactrim DS bid x 5 days; increase fluids, rest and follow up worse, no better.   This visit occurred during the SARS-CoV-2 public health emergency.  Safety protocols were in place, including screening questions prior to the visit, additional usage of staff PPE, and extensive cleaning of exam room while observing appropriate contact time as indicated for disinfecting solutions.    No follow-ups on file.  Orders Placed This Encounter  Procedures   Urine Culture   POCT urinalysis dipstick    Requested Prescriptions   Signed Prescriptions Disp Refills   sulfamethoxazole-trimethoprim (BACTRIM DS) 800-160 MG tablet 10 tablet 0    Sig: Take 1 tablet by mouth 2 (two) times daily.

## 2021-08-07 ENCOUNTER — Ambulatory Visit: Payer: Self-pay | Admitting: Medical

## 2021-08-08 LAB — URINE CULTURE
MICRO NUMBER:: 12820419
SPECIMEN QUALITY:: ADEQUATE

## 2021-08-20 ENCOUNTER — Encounter: Payer: 59 | Admitting: Medical

## 2021-08-28 ENCOUNTER — Ambulatory Visit
Admission: RE | Admit: 2021-08-28 | Discharge: 2021-08-28 | Disposition: A | Discharge status: Home / Self Care | Payer: No Typology Code available for payment source | Source: Ambulatory Visit | Attending: Family Medicine | Admitting: Family Medicine

## 2021-08-28 DIAGNOSIS — N6341 Unspecified lump in right breast, subareolar: Secondary | ICD-10-CM

## 2021-09-16 ENCOUNTER — Ambulatory Visit (INDEPENDENT_AMBULATORY_CARE_PROVIDER_SITE_OTHER): Payer: No Typology Code available for payment source | Admitting: Medical

## 2021-09-16 VITALS — BP 108/68 | HR 82 | Temp 97.9°F | Ht 59.0 in | Wt 120.6 lb

## 2021-09-16 DIAGNOSIS — Z808 Family history of malignant neoplasm of other organs or systems: Secondary | ICD-10-CM

## 2021-09-16 DIAGNOSIS — R739 Hyperglycemia, unspecified: Secondary | ICD-10-CM

## 2021-09-16 DIAGNOSIS — Z0000 Encounter for general adult medical examination without abnormal findings: Secondary | ICD-10-CM

## 2021-09-16 DIAGNOSIS — Z113 Encounter for screening for infections with a predominantly sexual mode of transmission: Secondary | ICD-10-CM

## 2021-09-16 LAB — T4, FREE: Free T4: 0.92 ng/dL (ref 0.60–1.60)

## 2021-09-16 LAB — CBC WITH DIFFERENTIAL/PLATELET
Basophils Absolute: 0 10*3/uL (ref 0.0–0.1)
Basophils Relative: 0.4 % (ref 0.0–3.0)
Eosinophils Absolute: 0.1 10*3/uL (ref 0.0–0.7)
Eosinophils Relative: 2.2 % (ref 0.0–5.0)
HCT: 39.8 % (ref 36.0–46.0)
Hemoglobin: 13 g/dL (ref 12.0–15.0)
Lymphocytes Relative: 33.1 % (ref 12.0–46.0)
Lymphs Abs: 1.9 10*3/uL (ref 0.7–4.0)
MCHC: 32.5 g/dL (ref 30.0–36.0)
MCV: 89.1 fl (ref 78.0–100.0)
Monocytes Absolute: 0.3 10*3/uL (ref 0.1–1.0)
Monocytes Relative: 5.5 % (ref 3.0–12.0)
Neutro Abs: 3.4 10*3/uL (ref 1.4–7.7)
Neutrophils Relative %: 58.8 % (ref 43.0–77.0)
Platelets: 302 10*3/uL (ref 150.0–400.0)
RBC: 4.47 Mil/uL (ref 3.87–5.11)
RDW: 14.4 % (ref 11.5–15.5)
WBC: 5.7 10*3/uL (ref 4.0–10.5)

## 2021-09-16 LAB — TSH: TSH: 1.48 u[IU]/mL (ref 0.35–5.50)

## 2021-09-16 LAB — HEMOGLOBIN A1C: Hgb A1c MFr Bld: 5.7 % (ref 4.6–6.5)

## 2021-09-16 NOTE — Patient Instructions (Addendum)
For you wellness exam today I have ordered cbc, cmp and lipid panel.  Vaccine appear up to date.  Recommend exercise and healthy diet.  We will let you know lab results as they come in.  Including tsh and t4 on your labs based on family history and your concern.  For elevated blood sugar will get a1c.  Follow up date appointment will be determined after lab review.     Preventive Care 30-33 Years Old, Female Preventive care refers to lifestyle choices and visits with your health care provider that can promote health and wellness. Preventive care visits are also called wellness exams. What can I expect for my preventive care visit? Counseling During your preventive care visit, your health care provider may ask about your: Medical history, including: Past medical problems. Family medical history. Pregnancy history. Current health, including: Menstrual cycle. Method of birth control. Emotional well-being. Home life and relationship well-being. Sexual activity and sexual health. Lifestyle, including: Alcohol, nicotine or tobacco, and drug use. Access to firearms. Diet, exercise, and sleep habits. Work and work Statistician. Sunscreen use. Safety issues such as seatbelt and bike helmet use. Physical exam Your health care provider may check your: Height and weight. These may be used to calculate your BMI (body mass index). BMI is a measurement that tells if you are at a healthy weight. Waist circumference. This measures the distance around your waistline. This measurement also tells if you are at a healthy weight and may help predict your risk of certain diseases, such as type 2 diabetes and high blood pressure. Heart rate and blood pressure. Body temperature. Skin for abnormal spots. What immunizations do I need? Vaccines are usually given at various ages, according to a schedule. Your health care provider will recommend vaccines for you based on your age, medical history, and  lifestyle or other factors, such as travel or where you work. What tests do I need? Screening Your health care provider may recommend screening tests for certain conditions. This may include: Pelvic exam and Pap test. Lipid and cholesterol levels. Diabetes screening. This is done by checking your blood sugar (glucose) after you have not eaten for a while (fasting). Hepatitis B test. Hepatitis C test. HIV (human immunodeficiency virus) test. STI (sexually transmitted infection) testing, if you are at risk. BRCA-related cancer screening. This may be done if you have a family history of breast, ovarian, tubal, or peritoneal cancers. Talk with your health care provider about your test results, treatment options, and if necessary, the need for more tests. Follow these instructions at home: Eating and drinking  Eat a healthy diet that includes fresh fruits and vegetables, whole grains, lean protein, and low-fat dairy products. Take vitamin and mineral supplements as recommended by your health care provider. Do not drink alcohol if: Your health care provider tells you not to drink. You are pregnant, may be pregnant, or are planning to become pregnant. If you drink alcohol: Limit how much you have to 0-1 drink a day. Know how much alcohol is in your drink. In the [place] one drink equals one 12 oz bottle of beer (355 mL), one 5 oz glass of wine (148 mL), or one 1 oz glass of hard liquor (44 mL). Lifestyle Brush your teeth every morning and night with fluoride toothpaste. Floss one time each day. Exercise for at least 30 minutes 5 or more days each week. Do not use any products that contain nicotine or tobacco. These products include cigarettes, chewing tobacco, and vaping devices,  such as e-cigarettes. If you need help quitting, ask your health care provider. Do not use drugs. If you are sexually active, practice safe sex. Use a condom or other form of protection to prevent STIs. If you do not  wish to become pregnant, use a form of birth control. If you plan to become pregnant, see your health care provider for a prepregnancy visit. Find healthy ways to manage stress, such as: Meditation, yoga, or listening to music. Journaling. Talking to a trusted person. Spending time with friends and family. Minimize exposure to UV radiation to reduce your risk of skin cancer. Safety Always wear your seat belt while driving or riding in a vehicle. Do not drive: If you have been drinking alcohol. Do not ride with someone who has been drinking. If you have been using any mind-altering substances or drugs. While texting. When you are tired or distracted. Wear a helmet and other protective equipment during sports activities. If you have firearms in your house, make sure you follow all gun safety procedures. Seek help if you have been physically or sexually abused. What's next? Go to your health care provider once a year for an annual wellness visit. Ask your health care provider how often you should have your eyes and teeth checked. Stay up to date on all vaccines. This information is not intended to replace advice given to you by your health care provider. Make sure you discuss any questions you have with your health care provider. Document Revised: 01/16/2021 Document Reviewed: 01/16/2021 Elsevier Patient Education  [facility] [place] [facility].

## 2021-09-16 NOTE — Progress Notes (Signed)
Subjective:    Patient ID: Crystal Joseph, female    DOB: [birth year], 31 y.o.   MRN: [id number]  HPI  Pt in for wellness exam. She is fasting.  Recent dad dx with thyroid cancer so causes some concern for pt. Denies fatigue.  Pt last a1c was 6.0.  Pt works for [employer]. Pt does not exercise regularly. Walks a lot at work. Has 8 month old boy. Recently eating better/less sugar. No alcohol use. No caffeine.  Review of Systems  Constitutional:  Negative for chills, fatigue and fever.  HENT:  Negative for dental problem and ear pain.   Respiratory:  Negative for cough, chest tightness, shortness of breath and wheezing.   Cardiovascular:  Negative for chest pain and palpitations.  Gastrointestinal:  Negative for abdominal pain, blood in stool and constipation.  Genitourinary:  Negative for dysuria, flank pain, frequency and hematuria.  Musculoskeletal:  Negative for back pain, joint swelling, myalgias and neck stiffness.  Skin:  Negative for rash.  Neurological:  Negative for seizures, syncope, facial asymmetry, speech difficulty, weakness and light-headedness.  Hematological:  Negative for adenopathy. Does not bruise/bleed easily.  Psychiatric/Behavioral:  Negative for agitation, behavioral problems and dysphoric mood. The patient is not nervous/anxious.     Past Medical History:  Diagnosis Date   Gestational diabetes    not diabetic. last a1c prediabetic range.   Uterine fibroid      Social History   Socioeconomic History   Marital status: Married    Spouse name: Not on file   Number of children: Not on file   Years of education: Not on file   Highest education level: Not on file  Occupational History   Occupation: nurse  Tobacco Use   Smoking status: Never   Smokeless tobacco: Never  Vaping Use   Vaping Use: Never used  Substance and Sexual Activity   Alcohol use: Never   Drug use: Never   Sexual activity: Yes    Birth control/protection: None  Other  Topics Concern   Not on file  Social History Narrative   Not on file   Social Determinants of Health   Financial Resource Strain: Not on file  Food Insecurity: Not on file  Transportation Needs: Not on file  Physical Activity: Not on file  Stress: Not on file  Social Connections: Not on file  Intimate Partner Violence: Not on file    Past Surgical History:  Procedure Laterality Date   CESAREAN SECTION N/A 04/19/2020   Procedure: CESAREAN SECTION;  Surgeon: Osborne Oman, MD;  Location: [facility];  Service: Obstetrics;  Laterality: N/A;    No family history on file.  No Known Allergies  Current Outpatient Medications on File Prior to Visit  Medication Sig Dispense Refill   Multiple Vitamin (MULTIVITAMIN) capsule Take 1 capsule by mouth daily.     sulfamethoxazole-trimethoprim (BACTRIM DS) 800-160 MG tablet Take 1 tablet by mouth 2 (two) times daily. 10 tablet 0   No current facility-administered medications on file prior to visit.    BP 108/68    Pulse 82    Temp 97.9 F (36.6 C)    Ht 4\' 11"  (1.499 m)    Wt 120 lb 9.6 oz (54.7 kg)    LMP 09/13/2021 (Exact Date)    SpO2 100%    BMI 24.36 kg/m       Objective:   Physical Exam  General Mental Status- Alert. General Appearance- Not in acute distress.  Skin General: Color- Normal Color. Moisture- Normal Moisture.  Neck Carotid Arteries- Normal color. Moisture- Normal Moisture. No carotid bruits. No JVD.  Chest and Lung Exam Auscultation: Breath Sounds:-Normal.  Cardiovascular Auscultation:Rythm- Regular. Murmurs & Other Heart Sounds:Auscultation of the heart reveals- No Murmurs.  Abdomen Inspection:-Inspeection Normal. Palpation/Percussion:Note:No mass. Palpation and Percussion of the abdomen reveal- Non Tender, Non Distended + BS, no rebound or guarding.   Neurologic Cranial Nerve exam:- CN III-XII intact(No nystagmus), symmetric smile. Strength:- 5/5 equal and symmetric strength both upper and lower  extremities.       Assessment & Plan:   Patient Instructions  For you wellness exam today I have ordered cbc, cmp and lipid panel.  Vaccine appear up to date.  Recommend exercise and healthy diet.  We will let you know lab results as they come in.  Including tsh and t4 on your labs based on family history and your concern.  Follow up date appointment will be determined after lab review.      Mackie Pai, PA-C

## 2021-09-16 NOTE — Addendum Note (Signed)
Addended by: Anabel Halon on: 09/16/2021 08:37 AM   Modules accepted: Orders

## 2021-09-17 LAB — COMPREHENSIVE METABOLIC PANEL
ALT: 11 U/L (ref 0–35)
AST: 12 U/L (ref 0–37)
Albumin: 4.7 g/dL (ref 3.5–5.2)
Alkaline Phosphatase: 55 U/L (ref 39–117)
BUN: 22 mg/dL (ref 6–23)
CO2: 29 mEq/L (ref 19–32)
Calcium: 9.2 mg/dL (ref 8.4–10.5)
Chloride: 106 mEq/L (ref 96–112)
Creatinine, Ser: 0.6 mg/dL (ref 0.40–1.20)
GFR: 118.26 mL/min (ref >60.00)
Glucose, Bld: 104 mg/dL — ABNORMAL HIGH (ref 70–99)
Potassium: 4.8 mEq/L (ref 3.5–5.1)
Sodium: 139 mEq/L (ref 135–145)
Total Bilirubin: 0.3 mg/dL (ref 0.2–1.2)
Total Protein: 7.8 g/dL (ref 6.0–8.3)

## 2021-09-17 LAB — LIPID PANEL
Cholesterol: 176 mg/dL (ref 0–200)
HDL: 62.2 mg/dL (ref >39.00)
LDL Cholesterol: 94 mg/dL (ref 0–99)
NonHDL: 113.94
Total CHOL/HDL Ratio: 3
Triglycerides: 100 mg/dL (ref 0.0–149.0)
VLDL: 20 mg/dL (ref 0.0–40.0)

## 2021-09-17 LAB — HIV ANTIBODY (ROUTINE TESTING W REFLEX): HIV 1&2 Ab, 4th Generation: NONREACTIVE

## 2021-12-13 ENCOUNTER — Encounter: Payer: Self-pay | Admitting: Medical

## 2021-12-16 ENCOUNTER — Ambulatory Visit (INDEPENDENT_AMBULATORY_CARE_PROVIDER_SITE_OTHER): Payer: No Typology Code available for payment source | Admitting: Family Medicine

## 2021-12-16 ENCOUNTER — Other Ambulatory Visit (HOSPITAL_BASED_OUTPATIENT_CLINIC_OR_DEPARTMENT_OTHER): Payer: Self-pay

## 2021-12-16 ENCOUNTER — Ambulatory Visit: Payer: No Typology Code available for payment source | Admitting: Family

## 2021-12-16 ENCOUNTER — Encounter: Payer: Self-pay | Admitting: Family Medicine

## 2021-12-16 VITALS — BP 98/68 | HR 73 | Temp 98.0°F | Resp 18 | Ht 59.0 in | Wt 119.4 lb

## 2021-12-16 DIAGNOSIS — J069 Acute upper respiratory infection, unspecified: Secondary | ICD-10-CM | POA: Diagnosis not present

## 2021-12-16 DIAGNOSIS — J40 Bronchitis, not specified as acute or chronic: Secondary | ICD-10-CM | POA: Diagnosis not present

## 2021-12-16 MED ORDER — AZITHROMYCIN 250 MG PO TABS
ORAL_TABLET | ORAL | 0 refills | Status: DC
  Filled 2021-12-16: qty 6, 5d supply, fill #0

## 2021-12-16 MED ORDER — BENZONATATE 100 MG PO CAPS
100.0000 mg | ORAL_CAPSULE | Freq: Three times a day (TID) | ORAL | 0 refills | Status: DC | PRN
  Filled 2021-12-16: qty 20, 7d supply, fill #0

## 2021-12-16 MED ORDER — FLUTICASONE PROPIONATE 50 MCG/ACT NA SUSP
2.0000 | Freq: Every day | NASAL | 6 refills | Status: DC
  Filled 2021-12-16: qty 16, 30d supply, fill #0

## 2021-12-16 MED ORDER — LORATADINE 10 MG PO TABS
10.0000 mg | ORAL_TABLET | Freq: Every day | ORAL | 11 refills | Status: DC
  Filled 2021-12-16: qty 100, 100d supply, fill #0
  Filled 2022-04-20: qty 100, 100d supply, fill #1

## 2021-12-16 MED ORDER — SULFAMETHOXAZOLE-TRIMETHOPRIM 800-160 MG PO TABS
1.0000 | ORAL_TABLET | Freq: Two times a day (BID) | ORAL | 0 refills | Status: DC
  Filled 2021-12-16: qty 10, 5d supply, fill #0

## 2021-12-16 NOTE — Assessment & Plan Note (Signed)
Symptoms x 2 weeks  ?z pak ?Cough med per orders  ?

## 2021-12-16 NOTE — Progress Notes (Signed)
? ?Subjective:  ? ?By signing my name below, I, [name], attest that this documentation has been prepared under the direction and in the presence of Roma Schanz DO, 12/16/2021   ? ? Patient ID: Crystal Joseph, female    DOB: September 30, 1990, 31 y.o.   MRN: [id number] ? ?Chief Complaint  ?Patient presents with  ? Nasal Congestion  ?  Pt states no longer having sore throat but states still having stuffy and runny nose. No fever. Negative COVID test.   ? ? ?HPI ?Patient is in today for an office visit ? ?She complains of persistent coughing, stuffy nose and runny nose that begun about 2 weeks ago. She originally had symptoms of muscle aches and sore throat about 2 weeks ago as well but she has been taking Theraflu and notes that the muscle aches and sore throat are now resolved. Her coughing symptoms are described as dry and comes in goes in terms of frequency. The cough does not keep her awake at night. Her stuffy nose and runny nose have been bothering her the most. She noted greenish mucous from her nose. She states that she is breathing regularly. She denies of any fever, lost of taste or allergies. She has taken a Covid 19 test and results came back negative.  ? ?Past Medical History:  ?Diagnosis Date  ? Gestational diabetes   ? not diabetic. last a1c prediabetic range.  ? Uterine fibroid   ? ? ?Past Surgical History:  ?Procedure Laterality Date  ? CESAREAN SECTION N/A 04/19/2020  ? Procedure: CESAREAN SECTION;  Surgeon: Osborne Oman, MD;  Location: [facility];  Service: Obstetrics;  Laterality: N/A;  ? ? ?No family history on file. ? ?Social History  ? ?Socioeconomic History  ? Marital status: Married  ?  Spouse name: Not on file  ? Number of children: Not on file  ? Years of education: Not on file  ? Highest education level: Not on file  ?Occupational History  ? Occupation: nurse  ?Tobacco Use  ? Smoking status: Never  ? Smokeless tobacco: Never  ?Vaping Use  ? Vaping Use: Never used  ?Substance and  Sexual Activity  ? Alcohol use: Never  ? Drug use: Never  ? Sexual activity: Yes  ?  Birth control/protection: None  ?Other Topics Concern  ? Not on file  ?Social History Narrative  ? Not on file  ? ?Social Determinants of Health  ? ?Financial Resource Strain: Not on file  ?Food Insecurity: Not on file  ?Transportation Needs: Not on file  ?Physical Activity: Not on file  ?Stress: Not on file  ?Social Connections: Not on file  ?Intimate Partner Violence: Not on file  ? ? ?Outpatient Medications Prior to Visit  ?Medication Sig Dispense Refill  ? Multiple Vitamin (MULTIVITAMIN) capsule Take 1 capsule by mouth daily.    ? sulfamethoxazole-trimethoprim (BACTRIM DS) 800-160 MG tablet Take 1 tablet by mouth 2 (two) times daily. (Patient not taking: Reported on 12/16/2021) 10 tablet 0  ? ?No facility-administered medications prior to visit.  ? ? ?No Known Allergies ? ?Review of Systems  ?Constitutional:  Negative for chills, fever and malaise/fatigue.  ?HENT:  Positive for congestion and sore throat.   ?Eyes:  Negative for blurred vision.  ?Respiratory:  Positive for cough (Dry), sputum production and wheezing. Negative for shortness of breath.   ?Cardiovascular:  Negative for chest pain, palpitations and leg swelling.  ?Gastrointestinal:  Negative for vomiting.  ?Musculoskeletal:  Negative for back pain.  ?  Skin:  Negative for rash.  ?Neurological:  Negative for loss of consciousness and headaches.  ? ?   ?Objective:  ?  ?Physical Exam ?Vitals and nursing note reviewed.  ?Constitutional:   ?   General: She is not in acute distress. ?   Appearance: Normal appearance. She is not ill-appearing.  ?HENT:  ?   Head: Normocephalic and atraumatic.  ?   Right Ear: External ear normal.  ?   Left Ear: External ear normal.  ?Eyes:  ?   Extraocular Movements: Extraocular movements intact.  ?   Pupils: Pupils are equal, round, and reactive to light.  ?Cardiovascular:  ?   Rate and Rhythm: Normal rate and regular rhythm.  ?   Heart sounds:  Normal heart sounds. No murmur heard. ?  No gallop.  ?Pulmonary:  ?   Effort: Pulmonary effort is normal. No respiratory distress.  ?   Breath sounds: Examination of the right-upper field reveals wheezing. Examination of the left-upper field reveals wheezing. Examination of the right-middle field reveals wheezing. Examination of the left-middle field reveals wheezing. Examination of the right-lower field reveals wheezing. Examination of the left-lower field reveals wheezing. Wheezing present. No rales.  ?Lymphadenopathy:  ?   Cervical: No cervical adenopathy.  ?   Right cervical: No superficial cervical adenopathy. ?   Left cervical: No superficial cervical adenopathy.  ?Skin: ?   General: Skin is warm and dry.  ?Neurological:  ?   Mental Status: She is alert and oriented to person, place, and time.  ?Psychiatric:     ?   Judgment: Judgment normal.  ? ? ?BP 98/68 (BP Location: Right Arm, Patient Position: Sitting, Cuff Size: Normal)   Pulse 73   Temp 98 ?F (36.7 ?C) (Oral)   Resp 18   Ht '4\' 11"'$  (1.499 m)   Wt 119 lb 6.4 oz (54.2 kg)   SpO2 99%   BMI 24.12 kg/m?  ?Wt Readings from Last 3 Encounters:  ?12/16/21 119 lb 6.4 oz (54.2 kg)  ?09/16/21 120 lb 9.6 oz (54.7 kg)  ?08/06/21 118 lb 12.8 oz (53.9 kg)  ? ? ?Diabetic Foot Exam - Simple   ?No data filed ?  ? ?Lab Results  ?Component Value Date  ? WBC 5.7 09/16/2021  ? HGB 13.0 09/16/2021  ? HCT 39.8 09/16/2021  ? PLT 302.0 09/16/2021  ? GLUCOSE 104 (H) 09/16/2021  ? CHOL 176 09/16/2021  ? TRIG 100.0 09/16/2021  ? HDL 62.20 09/16/2021  ? [place] 94 09/16/2021  ? ALT 11 09/16/2021  ? AST 12 09/16/2021  ? NA 139 09/16/2021  ? K 4.8 09/16/2021  ? CL 106 09/16/2021  ? CREATININE 0.60 09/16/2021  ? BUN 22 09/16/2021  ? CO2 29 09/16/2021  ? TSH 1.48 09/16/2021  ? HGBA1C 5.7 09/16/2021  ? ? ?Lab Results  ?Component Value Date  ? TSH 1.48 09/16/2021  ? ?Lab Results  ?Component Value Date  ? WBC 5.7 09/16/2021  ? HGB 13.0 09/16/2021  ? HCT 39.8 09/16/2021  ? MCV 89.1  09/16/2021  ? PLT 302.0 09/16/2021  ? ?Lab Results  ?Component Value Date  ? NA 139 09/16/2021  ? K 4.8 09/16/2021  ? CO2 29 09/16/2021  ? GLUCOSE 104 (H) 09/16/2021  ? BUN 22 09/16/2021  ? CREATININE 0.60 09/16/2021  ? BILITOT 0.3 09/16/2021  ? ALKPHOS 55 09/16/2021  ? AST 12 09/16/2021  ? ALT 11 09/16/2021  ? PROT 7.8 09/16/2021  ? ALBUMIN 4.7 09/16/2021  ? CALCIUM 9.2  09/16/2021  ? GFR 118.26 09/16/2021  ? ?Lab Results  ?Component Value Date  ? CHOL 176 09/16/2021  ? ?Lab Results  ?Component Value Date  ? HDL 62.20 09/16/2021  ? ?Lab Results  ?Component Value Date  ? [place] 94 09/16/2021  ? ?Lab Results  ?Component Value Date  ? TRIG 100.0 09/16/2021  ? ?Lab Results  ?Component Value Date  ? CHOLHDL 3 09/16/2021  ? ?Lab Results  ?Component Value Date  ? HGBA1C 5.7 09/16/2021  ? ? ?   ?Assessment & Plan:  ? ?Problem List Items Addressed This Visit   ? ?  ? Unprioritized  ? Upper respiratory tract infection  ?  flonase and antihistamine  ?rto prn  ? ?  ?  ? Relevant Medications  ? azithromycin (ZITHROMAX Z-PAK) 250 MG tablet  ? fluticasone (FLONASE) 50 MCG/ACT nasal spray  ? loratadine (CLARITIN) 10 MG tablet  ? benzonatate (TESSALON PERLES) 100 MG capsule  ? sulfamethoxazole-trimethoprim (BACTRIM DS) 800-160 MG tablet  ? Bronchitis - Primary  ?  Symptoms x 2 weeks  ?z pak ?Cough med per orders  ? ?  ?  ? Relevant Medications  ? azithromycin (ZITHROMAX Z-PAK) 250 MG tablet  ? benzonatate (TESSALON PERLES) 100 MG capsule  ? sulfamethoxazole-trimethoprim (BACTRIM DS) 800-160 MG tablet  ? ? ? ? ?Meds ordered this encounter  ?Medications  ? azithromycin (ZITHROMAX Z-PAK) 250 MG tablet  ?  Sig: Take 2 tablets by mouth today, then 1 tablet daily until finished  ?  Dispense:  6 each  ?  Refill:  0  ? fluticasone (FLONASE) 50 MCG/ACT nasal spray  ?  Sig: Place 2 sprays into both nostrils daily.  ?  Dispense:  16 g  ?  Refill:  6  ? loratadine (CLARITIN) 10 MG tablet  ?  Sig: Take 1 tablet (10 mg total) by mouth daily.  ?   Dispense:  100 tablet  ?  Refill:  11  ? benzonatate (TESSALON PERLES) 100 MG capsule  ?  Sig: Take 1 capsule (100 mg total) by mouth 3 (three) times daily as needed for cough.  ?  Dispense:  20 capsule  ?  Refil

## 2021-12-16 NOTE — Patient Instructions (Signed)
Acute Bronchitis, Adult ? ?Acute bronchitis is sudden inflammation of the main airways (bronchi) that come off the windpipe (trachea) in the lungs. The swelling causes the airways to get smaller and make more mucus than normal. This can make it hard to breathe and can cause coughing or noisy breathing (wheezing). ?Acute bronchitis may last several weeks. The cough may last longer. Allergies, asthma, and exposure to smoke may make the condition worse. ?What are the causes? ?This condition can be caused by germs and by substances that irritate the lungs, including: ?Cold and flu viruses. The most common cause of this condition is the virus that causes the common cold. ?Bacteria. This is less common. ?Breathing in substances that irritate the lungs, including: ?Smoke from cigarettes and other forms of tobacco. ?Dust and pollen. ?Fumes from household cleaning products, gases, or burned fuel. ?Indoor or outdoor air pollution. ?What increases the risk? ?The following factors may make you more likely to develop this condition: ?A weak body's defense system, also called the immune system. ?A condition that affects your lungs and breathing, such as asthma. ?What are the signs or symptoms? ?Common symptoms of this condition include: ?Coughing. This may bring up clear, yellow, or green mucus from your lungs (sputum). ?Wheezing. ?Runny or stuffy nose. ?Having too much mucus in your lungs (chest congestion). ?Shortness of breath. ?Aches and pains, including sore throat or chest. ?How is this diagnosed? ?This condition is usually diagnosed based on: ?Your symptoms and medical history. ?A physical exam. ?You may also have other tests, including tests to rule out other conditions, such as pneumonia. These tests include: ?A test of lung function. ?Test of a mucus sample to look for the presence of bacteria. ?Tests to check the oxygen level in your blood. ?Blood tests. ?Chest X-ray. ?How is this treated? ?Most cases of acute  bronchitis clear up over time without treatment. Your health care provider may recommend: ?Drinking more fluids to help thin your mucus so it is easier to cough up. ?Taking inhaled medicine (inhaler) to improve air flow in and out of your lungs. ?Using a vaporizer or a humidifier. These are machines that add water to the air to help you breathe better. ?Taking a medicine that thins mucus and clears congestion (expectorant). ?Taking a medicine that prevents or stops coughing (cough suppressant). ?It is notcommon to take an antibiotic medicine for this condition. ?Follow these instructions at home: ? ?Take over-the-counter and prescription medicines only as told by your health care provider. ?Use an inhaler, vaporizer, or humidifier as told by your health care provider. ?Take two teaspoons (10 mL) of honey at bedtime to lessen coughing at night. ?Drink enough fluid to keep your urine pale yellow. ?Do not use any products that contain nicotine or tobacco. These products include cigarettes, chewing tobacco, and vaping devices, such as e-cigarettes. If you need help quitting, ask your health care provider. ?Get plenty of rest. ?Return to your normal activities as told by your health care provider. Ask your health care provider what activities are safe for you. ?Keep all follow-up visits. This is important. ?How is this prevented? ?To lower your risk of getting this condition again: ?Wash your hands often with soap and water for at least 20 seconds. If soap and water are not available, use hand sanitizer. ?Avoid contact with people who have cold symptoms. ?Try not to touch your mouth, nose, or eyes with your hands. ?Avoid breathing in smoke or chemical fumes. Breathing smoke or chemical fumes will make your   condition worse. ?Get the flu shot every year. ?Contact a health care provider if: ?Your symptoms do not improve after 2 weeks. ?You have trouble coughing up the mucus. ?Your cough keeps you awake at night. ?You have a  fever. ?Get help right away if you: ?Cough up blood. ?Feel pain in your chest. ?Have severe shortness of breath. ?Faint or keep feeling like you are going to faint. ?Have a severe headache. ?Have a fever or chills that get worse. ?These symptoms may represent a serious problem that is an emergency. Do not wait to see if the symptoms will go away. Get medical help right away. Call your local emergency services (911 in the [place] Do not drive yourself to the hospital. ?Summary ?Acute bronchitis is inflammation of the main airways (bronchi) that come off the windpipe (trachea) in the lungs. The swelling causes the airways to get smaller and make more mucus than normal. ?Drinking more fluids can help thin your mucus so it is easier to cough up. ?Take over-the-counter and prescription medicines only as told by your health care provider. ?Do not use any products that contain nicotine or tobacco. These products include cigarettes, chewing tobacco, and vaping devices, such as e-cigarettes. If you need help quitting, ask your health care provider. ?Contact a health care provider if your symptoms do not improve after 2 weeks. ?This information is not intended to replace advice given to you by your health care provider. Make sure you discuss any questions you have with your health care provider. ?Document Revised: 11/21/2020 Document Reviewed: 11/21/2020 ?Elsevier Patient Education ? 2023 [employer] ? ?

## 2021-12-16 NOTE — Assessment & Plan Note (Signed)
flonase and antihistamine  ?rto prn  ?

## 2022-01-08 IMAGING — US US TRANSVAGINAL NON-OB
1 series · 14 of 25 positions shown · non-contrast
Comparison: 04/11/2020

CLINICAL DATA: Vaginal bleeding. IUD in place. History of fibroids.

EXAM:
ULTRASOUND PELVIS TRANSVAGINAL
TECHNIQUE: Transvaginal ultrasound examination of the pelvis was performed
including evaluation of the uterus, ovaries, adnexal regions, and
pelvic cul-de-sac.

[Series 1: us transvaginal non-ob · 69 acquisitions, 14 frames shown]
[im 1/69]
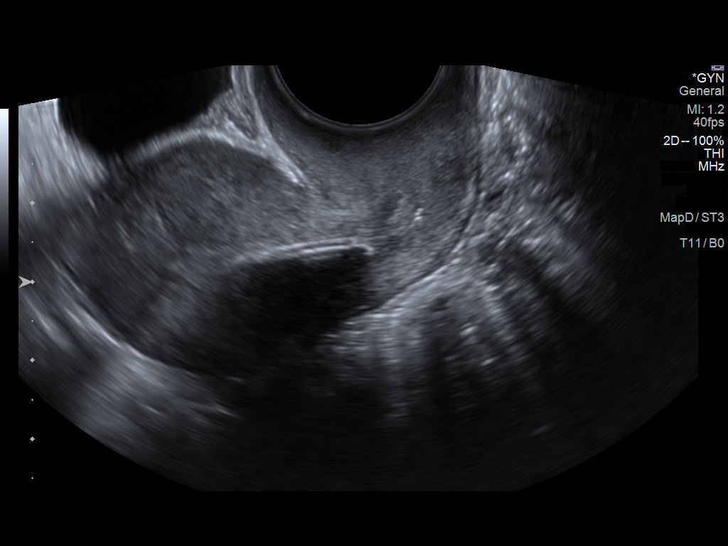
[im 6/69]
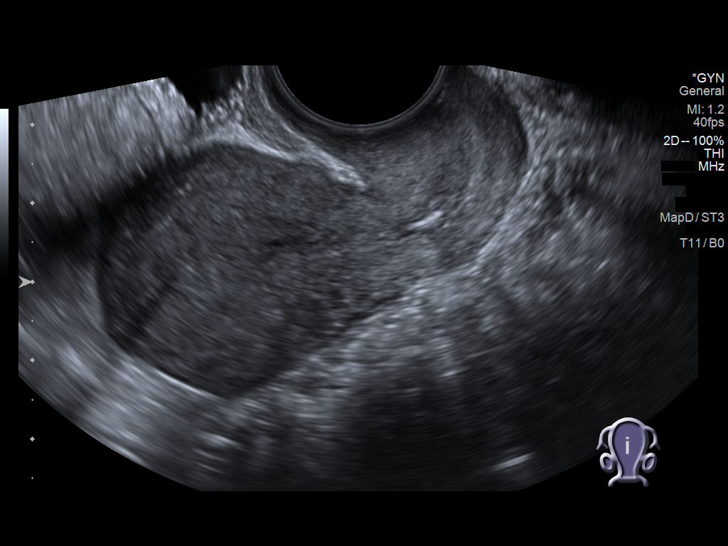
[im 12/69]
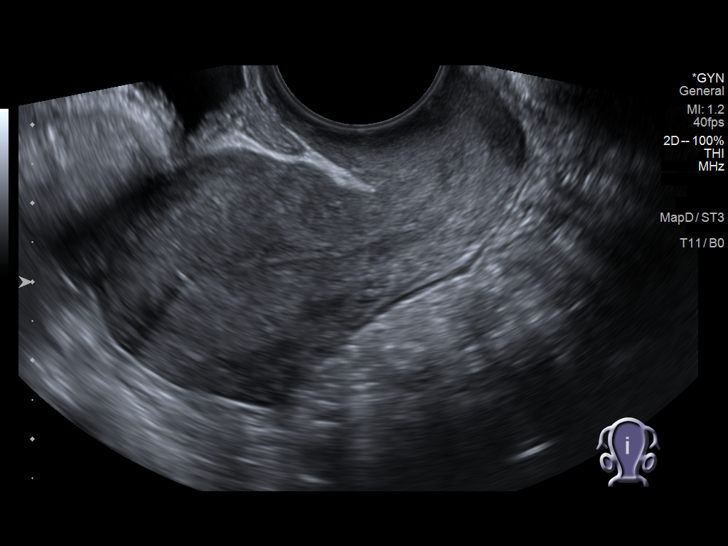
[im 18/69]
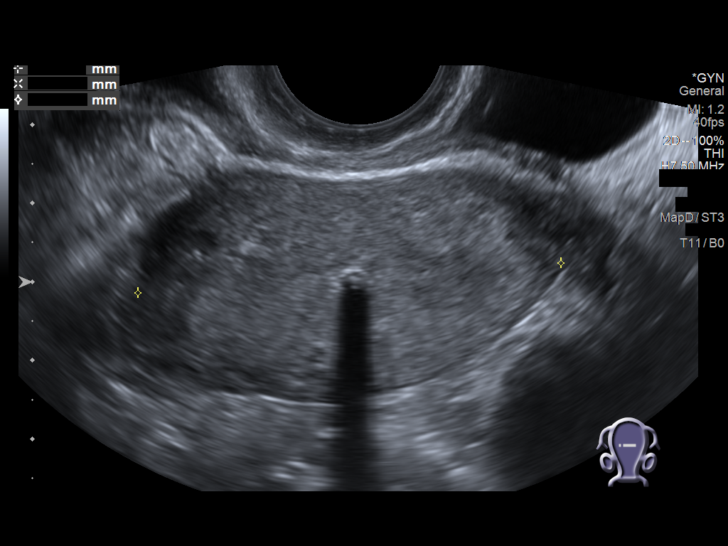
[im 23/69]
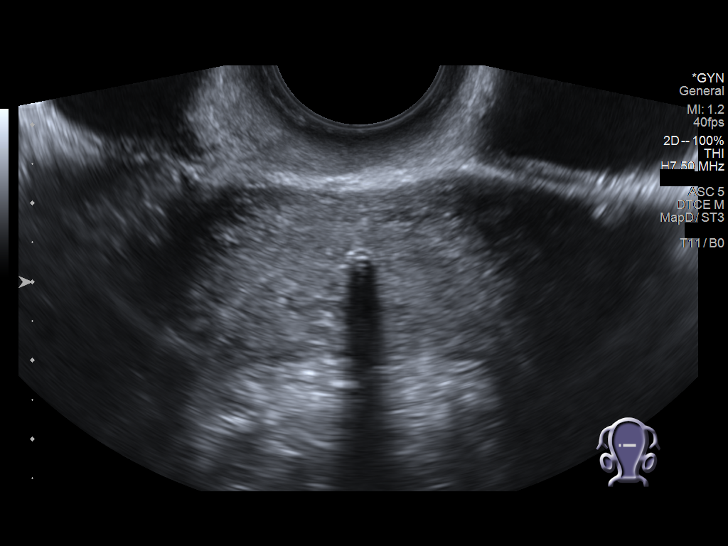
[im 26/69]
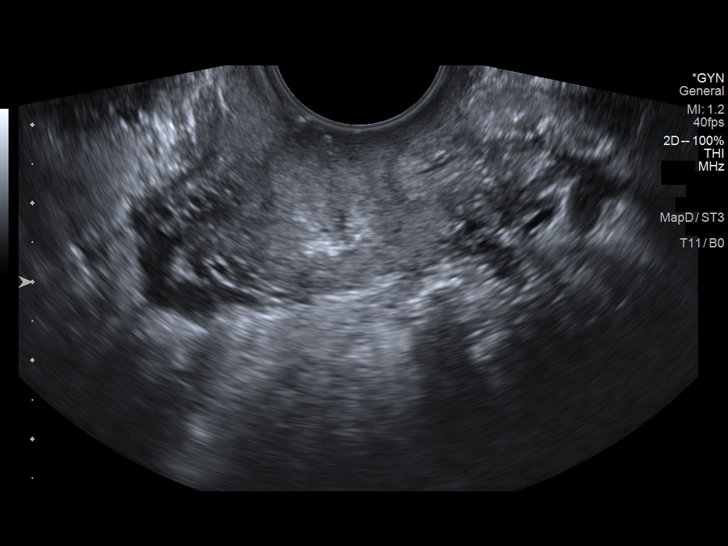
[im 32/69]
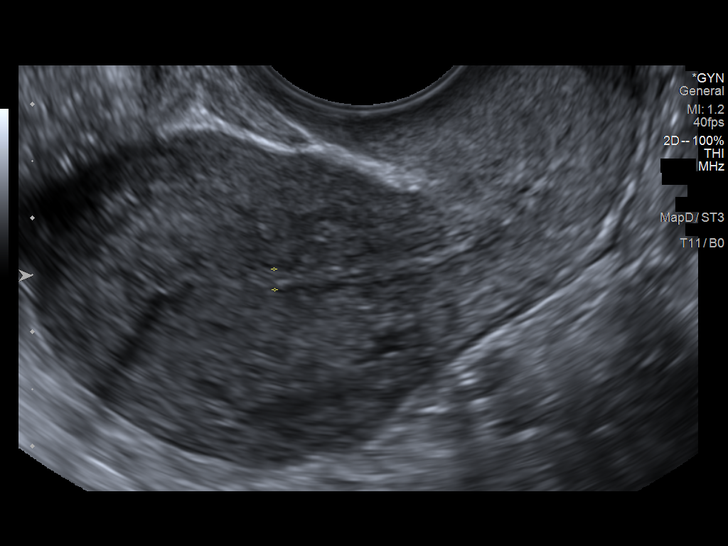
[im 37/69]
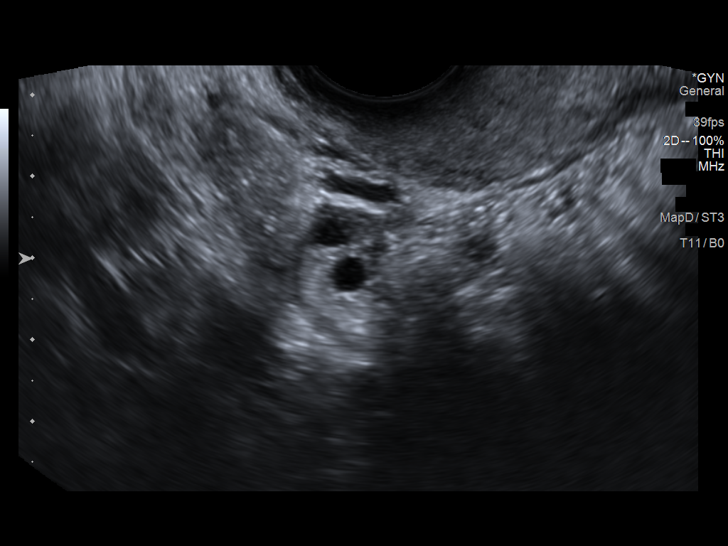
[im 43/69]
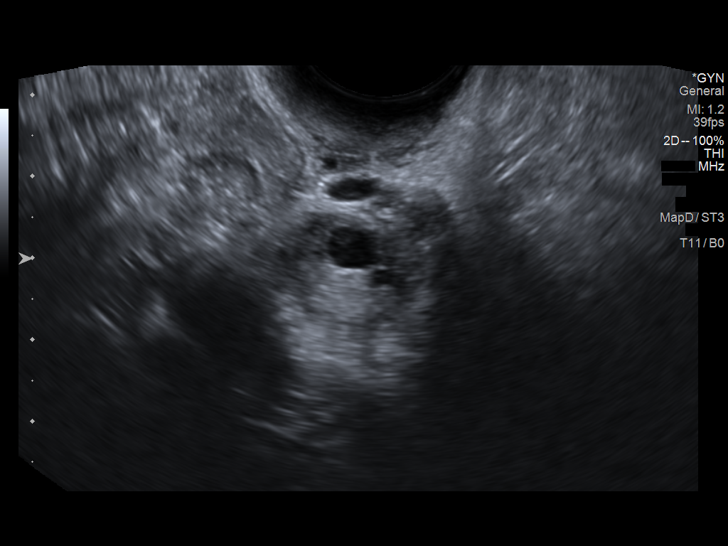
[im 46/69]
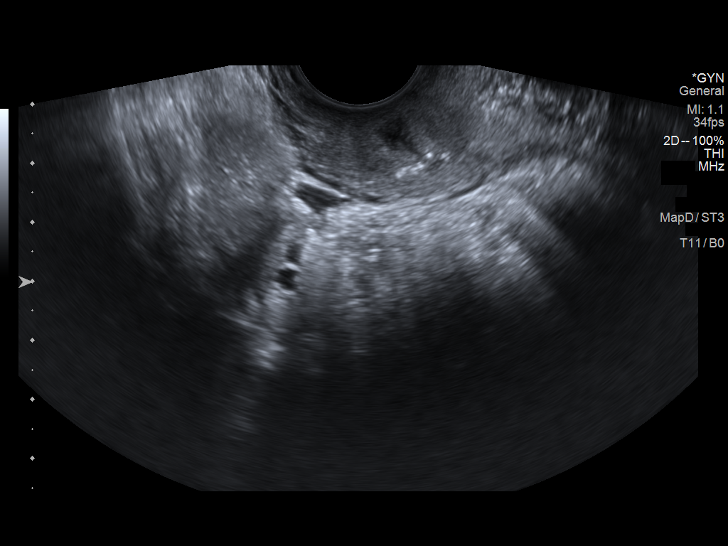
[im 52/69]
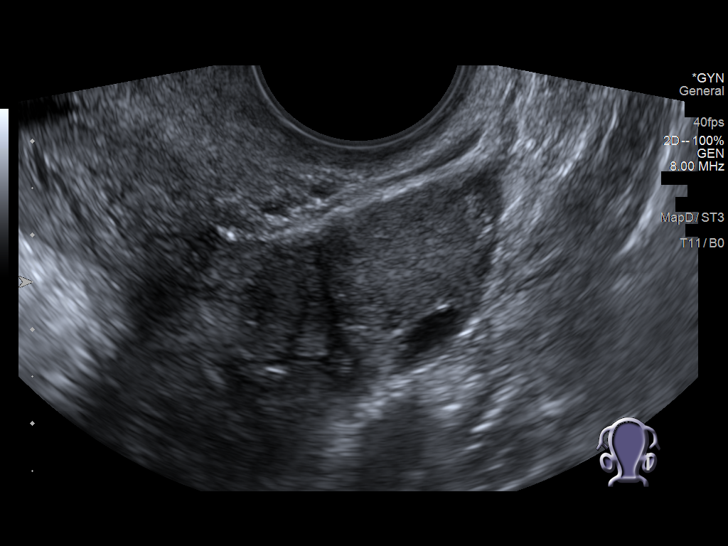
[im 57/69]
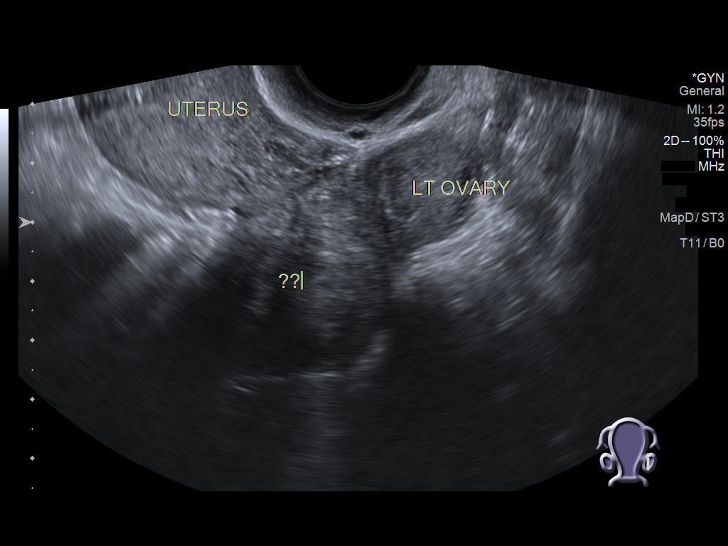
[im 63/69]
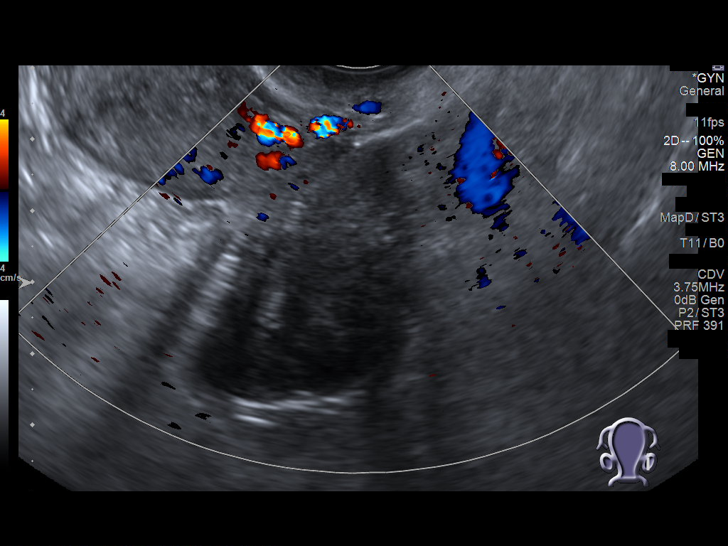
[im 69/69]
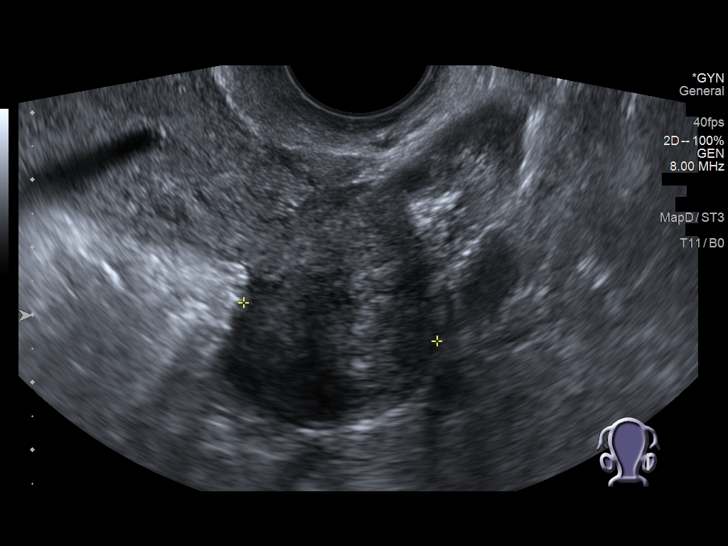

[14 of 25 positions shown; findings below may reference images not displayed]

FINDINGS: Uterus

Measurements: 5.9 x 3.2 x 5.4 cm = volume: 53 mL. No fibroids or
other mass visualized.

Endometrium

Thickness: 2 mm. There is an IUD in place that appears to be grossly
well position.

Right ovary

Measurements: 2.7 x 1.8 x 1.2 cm = volume: 3 mL. Normal
appearance/no adnexal mass.

Left ovary

Measurements: 2.6 x 1.8 x 2 cm = volume: 5 mL. Abutting the lower
uterine segment and left ovary is an indeterminate mass measuring
approximately 3.9 x 3.2 x 2.9 cm. This mass appears solid in nature
with minimal internal color Doppler flow and some areas of posterior
acoustic shadowing.

Other findings:  No abnormal free fluid
IMPRESSION: 1. No acute abnormality.
2. There is a 3.9 x 3.2 x 2.9 cm mass abutting the left ovary and
uterus. This is favored to represent the previously demonstrated
uterine fibroid.
3. Appropriately positioned IUD.

## 2022-01-17 ENCOUNTER — Other Ambulatory Visit (HOSPITAL_BASED_OUTPATIENT_CLINIC_OR_DEPARTMENT_OTHER): Payer: Self-pay

## 2022-01-17 ENCOUNTER — Ambulatory Visit (INDEPENDENT_AMBULATORY_CARE_PROVIDER_SITE_OTHER): Payer: No Typology Code available for payment source | Admitting: Family

## 2022-01-17 VITALS — BP 108/63 | HR 70 | Temp 98.5°F | Resp 16 | Wt 121.0 lb

## 2022-01-17 DIAGNOSIS — J029 Acute pharyngitis, unspecified: Secondary | ICD-10-CM

## 2022-01-17 LAB — POCT RAPID STREP A (OFFICE): Rapid Strep A Screen: NEGATIVE

## 2022-01-17 MED ORDER — BENZONATATE 100 MG PO CAPS
100.0000 mg | ORAL_CAPSULE | Freq: Three times a day (TID) | ORAL | 0 refills | Status: DC | PRN
  Filled 2022-01-17: qty 20, 7d supply, fill #0

## 2022-01-17 NOTE — Assessment & Plan Note (Signed)
Rapid strep negative, reports mild cough.  Has some right sided mild upper cervical LA. Suspect viral illness. Advised pt on supportive measures and to let us know if enlarged cervical LN is not improved in 1 week.

## 2022-01-17 NOTE — Progress Notes (Signed)
Subjective:   By signing my name below, I, [name], attest that this documentation has been prepared under the direction and in the presence of Debbrah Alar NP, 01/17/2022    Patient ID: Crystal Joseph, female    DOB: [birth year], 31 y.o.   MRN: [id number]  Chief Complaint  Patient presents with   Sore Throat    Complains of pain in throat at times on right side   Cough    Complains of dry cough    HPI Patient is in today for an office visit.  Swollen Right Lymph Node: She complains of a swollen node in her right throat below her right jaw. She has associated symptoms of coughing. She states that symptoms appeared about more than a week ago. She also states that her cough symptoms worsen during the evening. She denies of any pain or fever. She also denies being around any sick people. She reports that she has not taken a Covid-19 test.   Health Maintenance Due  Topic Date Due   FOOT EXAM  Never done   OPHTHALMOLOGY EXAM  Never done   URINE MICROALBUMIN  Never done    Past Medical History:  Diagnosis Date   Gestational diabetes    not diabetic. last a1c prediabetic range.   Uterine fibroid     Past Surgical History:  Procedure Laterality Date   CESAREAN SECTION N/A 04/19/2020   Procedure: CESAREAN SECTION;  Surgeon: Osborne Oman, MD;  Location: [facility];  Service: Obstetrics;  Laterality: N/A;    No family history on file.  Social History   Socioeconomic History   Marital status: Married    Spouse name: Not on file   Number of children: Not on file   Years of education: Not on file   Highest education level: Not on file  Occupational History   Occupation: nurse  Tobacco Use   Smoking status: Never   Smokeless tobacco: Never  Vaping Use   Vaping Use: Never used  Substance and Sexual Activity   Alcohol use: Never   Drug use: Never   Sexual activity: Yes    Birth control/protection: None  Other Topics Concern   Not on file  Social History  Narrative   Not on file   Social Determinants of Health   Financial Resource Strain: Not on file  Food Insecurity: Not on file  Transportation Needs: Not on file  Physical Activity: Not on file  Stress: Not on file  Social Connections: Not on file  Intimate Partner Violence: Not on file    Outpatient Medications Prior to Visit  Medication Sig Dispense Refill   loratadine (CLARITIN) 10 MG tablet Take 1 tablet (10 mg total) by mouth daily. 100 tablet 11   Multiple Vitamin (MULTIVITAMIN) capsule Take 1 capsule by mouth daily.     azithromycin (ZITHROMAX Z-PAK) 250 MG tablet Take 2 tablets by mouth today, then 1 tablet daily until finished 6 each 0   benzonatate (TESSALON PERLES) 100 MG capsule Take 1 capsule (100 mg total) by mouth 3 (three) times daily as needed for cough. 20 capsule 0   fluticasone (FLONASE) 50 MCG/ACT nasal spray Place 2 sprays into both nostrils daily. 16 g 6   sulfamethoxazole-trimethoprim (BACTRIM DS) 800-160 MG tablet Take 1 tablet by mouth 2 (two) times daily. 10 tablet 0   No facility-administered medications prior to visit.    No Known Allergies  Review of Systems  Constitutional:  Negative for fever.  HENT:  Positive for sore throat (Swollen Node Right Side).   Respiratory:  Positive for cough.        Objective:    Physical Exam Constitutional:      General: She is not in acute distress.    Appearance: Normal appearance. She is not ill-appearing.  HENT:     Head: Normocephalic and atraumatic.     Right Ear: Tympanic membrane, ear canal and external ear normal.     Left Ear: Tympanic membrane, ear canal and external ear normal.     Mouth/Throat:     Pharynx: No posterior oropharyngeal erythema.     Tonsils: No tonsillar exudate or tonsillar abscesses.  Eyes:     Extraocular Movements: Extraocular movements intact.     Pupils: Pupils are equal, round, and reactive to light.  Cardiovascular:     Rate and Rhythm: Normal rate and regular rhythm.      Heart sounds: Normal heart sounds. No murmur heard.    No gallop.  Pulmonary:     Effort: Pulmonary effort is normal. No respiratory distress.     Breath sounds: Normal breath sounds. No wheezing or rales.  Lymphadenopathy:     Cervical: Cervical adenopathy (Upper cervical) present.  Skin:    General: Skin is warm and dry.  Neurological:     Mental Status: She is alert and oriented to person, place, and time.  Psychiatric:        Mood and Affect: Mood normal.        Behavior: Behavior normal.        Judgment: Judgment normal.     BP 108/63 (BP Location: Right Arm, Patient Position: Sitting, Cuff Size: Small)   Pulse 70   Temp 98.5 F (36.9 C) (Oral)   Resp 16   Wt 121 lb (54.9 kg)   SpO2 99%   BMI 24.44 kg/m  Wt Readings from Last 3 Encounters:  01/17/22 121 lb (54.9 kg)  12/16/21 119 lb 6.4 oz (54.2 kg)  09/16/21 120 lb 9.6 oz (54.7 kg)       Assessment & Plan:   Problem List Items Addressed This Visit       Unprioritized   Sore throat - Primary    Rapid strep negative, reports mild cough.  Has some right sided mild upper cervical LA. Suspect viral illness. Advised pt on supportive measures and to let us know if enlarged cervical LN is not improved in 1 week.       Relevant Orders   POCT rapid strep A (Completed)    Meds ordered this encounter  Medications   benzonatate (TESSALON) 100 MG capsule    Sig: Take 1 capsule (100 mg total) by mouth 3 (three) times daily as needed.    Dispense:  20 capsule    Refill:  0    Order Specific Question:   Supervising Provider    Answer:   Penni Homans A [[id number]]    I, Nance Pear, NP, personally preformed the services described in this documentation.  All medical record entries made by the scribe were at my direction and in my presence.  I have reviewed the chart and discharge instructions (if applicable) and agree that the record reflects my personal performance and is accurate and complete.  01/17/2022   I,Amber Collins,acting as a Education administrator for Nance Pear, NP.,have documented all relevant documentation on the behalf of Nance Pear, NP,as directed by  Nance Pear, NP while in the presence of Melissa S  Inda Castle, NP.  Nance Pear, NP

## 2022-03-03 ENCOUNTER — Encounter: Payer: Self-pay | Admitting: Medical

## 2022-03-06 ENCOUNTER — Encounter: Payer: Self-pay | Admitting: Gastroenterology

## 2022-03-06 ENCOUNTER — Ambulatory Visit (INDEPENDENT_AMBULATORY_CARE_PROVIDER_SITE_OTHER): Payer: No Typology Code available for payment source | Admitting: Medical

## 2022-03-06 VITALS — BP 114/85 | HR 75 | Resp 18 | Ht 59.0 in | Wt 122.0 lb

## 2022-03-06 DIAGNOSIS — R1084 Generalized abdominal pain: Secondary | ICD-10-CM

## 2022-03-06 DIAGNOSIS — R195 Other fecal abnormalities: Secondary | ICD-10-CM | POA: Diagnosis not present

## 2022-03-06 DIAGNOSIS — K588 Other irritable bowel syndrome: Secondary | ICD-10-CM | POA: Diagnosis not present

## 2022-03-06 DIAGNOSIS — K921 Melena: Secondary | ICD-10-CM | POA: Diagnosis not present

## 2022-03-06 NOTE — Progress Notes (Signed)
Subjective:    Patient ID: Crystal Joseph, female    DOB: Apr 27, 1991, 31 y.o.   MRN: [id number]  HPI  Pt sent me message recently on my chart see below in "  "I have been experiencing BM habit problems recently. It has been a month & getting worse. Before that, i just got 1 BM everyday. Now it is 3-4 BM/ day & stool kinda dark/ greenish tarry  & close to diarrhea. At first, i thought it was normal & took immodium. It was better for a day & comming back. I'm concerning whether i have IBS or any tumors in my colon or even cancer or not since this problem has been a month & not getting better. Do i need to see you first or GI doctor? Im looking forward to hearing your answer. Thank you so much"   Asked pt to come in.  Pt states in past she had normal bm daily for years. Now having 3-4 stools. She states small, green and tarry. States close to diarrhea but not. Some mild occasional abdomen pain with passing bm.    Describes back and forth changing stool pattern. At time formed and solid then will change to loose but then resolve.  Pt does not exercise. Regularly. Pt staying well hydrated.  Lmp- 2 weeks ago. February 13, 2022.    Review of Systems  Constitutional:  Negative for chills, fatigue and fever.  HENT:  Negative for congestion, drooling, ear pain and facial swelling.   Respiratory:  Negative for cough, choking, shortness of breath and wheezing.   Cardiovascular:  Negative for chest pain and palpitations.  Gastrointestinal:  Positive for abdominal pain. Negative for blood in stool, diarrhea, nausea and vomiting.       See hpi. Change in stool pattern.  Musculoskeletal:  Negative for back pain.  Skin:  Negative for rash.   Past Medical History:  Diagnosis Date   Gestational diabetes    not diabetic. last a1c prediabetic range.   Uterine fibroid      Social History   Socioeconomic History   Marital status: Married    Spouse name: Not on file   Number of children: Not on file    Years of education: Not on file   Highest education level: Not on file  Occupational History   Occupation: nurse  Tobacco Use   Smoking status: Never   Smokeless tobacco: Never  Vaping Use   Vaping Use: Never used  Substance and Sexual Activity   Alcohol use: Never   Drug use: Never   Sexual activity: Yes    Birth control/protection: None  Other Topics Concern   Not on file  Social History Narrative   Not on file   Social Determinants of Health   Financial Resource Strain: Not on file  Food Insecurity: Not on file  Transportation Needs: Not on file  Physical Activity: Not on file  Stress: Not on file  Social Connections: Not on file  Intimate Partner Violence: Not on file    Past Surgical History:  Procedure Laterality Date   CESAREAN SECTION N/A 04/19/2020   Procedure: CESAREAN SECTION;  Surgeon: Osborne Oman, MD;  Location: [facility];  Service: Obstetrics;  Laterality: N/A;    No family history on file.  No Known Allergies  Current Outpatient Medications on File Prior to Visit  Medication Sig Dispense Refill   loratadine (CLARITIN) 10 MG tablet Take 1 tablet (10 mg total) by mouth daily. 100 tablet  11   Multiple Vitamin (MULTIVITAMIN) capsule Take 1 capsule by mouth daily.     No current facility-administered medications on file prior to visit.    BP 114/85   Pulse 75   Resp 18   Ht '4\' 11"'$  (1.499 m)   Wt 122 lb (55.3 kg)   SpO2 97%   BMI 24.64 kg/m        Objective:   Physical Exam  General Mental Status- Alert. General Appearance- Not in acute distress.   Skin General: Color- Normal Color. Moisture- Normal Moisture.  Neck Carotid Arteries- Normal color. Moisture- Normal Moisture. No carotid bruits. No JVD.  Chest and Lung Exam Auscultation: Breath Sounds:-Normal.  Cardiovascular Auscultation:Rythm- Regular. Murmurs & Other Heart Sounds:Auscultation of the heart reveals- No Murmurs.  Abdomen Inspection:-Inspeection  Normal. Palpation/Percussion:Note:No mass. Palpation and Percussion of the abdomen reveal- Non Tender, Non Distended + BS, no rebound or guarding.    Neurologic Cranial Nerve exam:- CN III-XII intact(No nystagmus), symmetric smile. Strength:- 5/5 equal and symmetric strength both upper and lower extremities.       Assessment & Plan:   Patient Instructions  Recent change in bowel pattern at time constipated with occasional loose stool. More frequent bm with green/tarry description at times over past month.  With change in bowel pattern you expressed concern ranging from IBS to cancer.  You do have some description of more IBS type symptoms.  However your description varies between IBS-C and potentially IBS-D.  Recommend with this pattern that you eat bland healthy diet and can use Metamucil 1 rounded tablespoon in 8 ounces of water 3 times daily.  Since you do report some tarry appearing stools at times placed order to check your stool for blood.   Also since you describe some loose stools at times will place future order for stool culture, O&P and C. difficile test.  However would collect the sample only if you have obviously loose watery stools.  Otherwise lab will not run the study.  Placing referral to GI MD as you had indicated desire for that on my chart note.  Follow-up as needed with me pending GI MD office visit.  If signs and symptoms change or worsen please let me know.   Mackie Pai, PA-C

## 2022-03-06 NOTE — Addendum Note (Signed)
Addended by: Manuela Schwartz on: 03/06/2022 11:01 AM   Modules accepted: Orders

## 2022-03-06 NOTE — Patient Instructions (Addendum)
Recent change in bowel pattern at time constipated with occasional loose stool. More frequent bm with green/tarry description at times over past month.  With change in bowel pattern you expressed concern ranging from IBS to cancer.  You do have some description of more IBS type symptoms.  However your description varies between IBS-C and potentially IBS-D.  Recommend with this pattern that you eat bland healthy diet and can use Metamucil 1 rounded tablespoon in 8 ounces of water 3 times daily.  Since you do report some tarry appearing stools at times placed order to check your stool for blood.   Also since you describe some loose stools at times will place future order for stool culture, O&P and C. difficile test.  However would collect the sample only if you have obviously loose watery stools.  Otherwise lab will not run the study.  Placing referral to GI MD as you had indicated desire for that on my chart note.  Follow-up as needed with me pending GI MD office visit.  If signs and symptoms change or worsen please let me know.

## 2022-03-11 ENCOUNTER — Other Ambulatory Visit (INDEPENDENT_AMBULATORY_CARE_PROVIDER_SITE_OTHER): Payer: No Typology Code available for payment source

## 2022-03-11 DIAGNOSIS — K921 Melena: Secondary | ICD-10-CM

## 2022-03-11 DIAGNOSIS — R195 Other fecal abnormalities: Secondary | ICD-10-CM

## 2022-03-12 LAB — FECAL OCCULT BLOOD, IMMUNOCHEMICAL: Fecal Occult Bld: NEGATIVE

## 2022-03-15 LAB — STOOL CULTURE: E coli, Shiga toxin Assay: NEGATIVE

## 2022-03-18 LAB — OVA AND PARASITE EXAMINATION
CONCENTRATE RESULT:: NONE SEEN
MICRO NUMBER:: 13750249
SPECIMEN QUALITY:: ADEQUATE
TRICHROME RESULT:: NONE SEEN

## 2022-04-16 ENCOUNTER — Encounter: Payer: Self-pay | Admitting: General Practice

## 2022-04-18 ENCOUNTER — Encounter: Payer: Self-pay | Admitting: Gastroenterology

## 2022-04-18 ENCOUNTER — Ambulatory Visit (INDEPENDENT_AMBULATORY_CARE_PROVIDER_SITE_OTHER): Payer: No Typology Code available for payment source | Admitting: Gastroenterology

## 2022-04-18 VITALS — BP 108/60 | HR 85 | Ht 59.0 in | Wt 122.8 lb

## 2022-04-18 DIAGNOSIS — R194 Change in bowel habit: Secondary | ICD-10-CM

## 2022-04-18 NOTE — Progress Notes (Unsigned)
HPI : Crystal Joseph is a very pleasant 31 year old female with no chronic medical problems who is referred to [place] by Dr. Mackie Pai for further evaluation of change in bowel habits.  The patient denied having any chronic GI symptoms until the end of July of this year, when she developed abrupt onset diarrhea, with 4-5 watery stools per day.  This lasted almost 2 weeks.  The diarrhea has resolved, but she has not resumed her typical bowel habits.  A stool culture, O&P exam, as well as fecal occult blood test were all negative.  She continues to have episodes of diarrhea characterized by loose stools, usually 1 or 2 a day, occurring maybe 3 times a week.  When her stools not loose, she states her stools are still not as large volume as they used to be and she has the sensation of incomplete evacuation.  She has 3 bowel movements per day on average. She denies any problems with abdominal pain other than a vague discomfort which comes and goes.  She is not seeing any blood in the stool.  Her weight is stable.  No nausea or vomiting.  Her appetite is good. She has no family history of colon cancer.  She was recommended to take Metamucil, which she said helped, but she does not still take it every day.  She also started taking turmeric powder. She has not made any dietary changes which preceded her change in bowel habits.  She denies any new medications. She was treated with antibiotics for bronchitis in May, but otherwise has not received any antibiotics.   Past Medical History:  Diagnosis Date   Gestational diabetes    not diabetic. last a1c prediabetic range.   Uterine fibroid      Past Surgical History:  Procedure Laterality Date   CESAREAN SECTION N/A 04/19/2020   Procedure: CESAREAN SECTION;  Surgeon: Osborne Oman, MD;  Location: [facility];  Service: Obstetrics;  Laterality: N/A;   No family history on file. Social History   Tobacco Use   Smoking status: Never   Smokeless tobacco:  Never  Vaping Use   Vaping Use: Never used  Substance Use Topics   Alcohol use: Never   Drug use: Never   Current Outpatient Medications  Medication Sig Dispense Refill   loratadine (CLARITIN) 10 MG tablet Take 1 tablet (10 mg total) by mouth daily. 100 tablet 11   Multiple Vitamin (MULTIVITAMIN) capsule Take 1 capsule by mouth daily.     No current facility-administered medications for this visit.   No Known Allergies   Review of Systems: All systems reviewed and negative except where noted in HPI.    No results found.  Physical Exam: BP 108/60   Pulse 85   Ht '4\' 11"'$  (1.499 m)   Wt 122 lb 12.8 oz (55.7 kg)   BMI 24.80 kg/m  Constitutional: Pleasant,well-developed, [place] female in no acute distress. HEENT: Normocephalic and atraumatic. Conjunctivae are normal. No scleral icterus. Neck supple.  Cardiovascular: Normal rate, regular rhythm.  Pulmonary/chest: Effort normal and breath sounds normal. No wheezing, rales or rhonchi. Abdominal: Soft, nondistended, nontender. Bowel sounds active throughout. There are no masses palpable. No hepatomegaly. Extremities: no edema Neurological: Alert and oriented to person place and time. Skin: Skin is warm and dry. No rashes noted. Psychiatric: Normal mood and affect. Behavior is normal.  CBC    Component Value Date/Time   WBC 5.7 09/16/2021 0841   RBC 4.47 09/16/2021 0841   HGB  13.0 09/16/2021 0841   HGB 11.5 02/10/2020 0909   HCT 39.8 09/16/2021 0841   HCT 34.6 02/10/2020 0909   PLT 302.0 09/16/2021 0841   PLT 259 02/10/2020 0909   MCV 89.1 09/16/2021 0841   MCV 92 02/10/2020 0909   MCH 29.5 04/20/2020 0503   MCHC 32.5 09/16/2021 0841   RDW 14.4 09/16/2021 0841   RDW 13.7 02/10/2020 0909   LYMPHSABS 1.9 09/16/2021 0841   LYMPHSABS 1.6 09/29/2019 1028   MONOABS 0.3 09/16/2021 0841   EOSABS 0.1 09/16/2021 0841   EOSABS 0.3 09/29/2019 1028   BASOSABS 0.0 09/16/2021 0841   BASOSABS 0.0 09/29/2019 1028    CMP      Component Value Date/Time   NA 139 09/16/2021 0841   K 4.8 09/16/2021 0841   CL 106 09/16/2021 0841   CO2 29 09/16/2021 0841   GLUCOSE 104 (H) 09/16/2021 0841   BUN 22 09/16/2021 0841   CREATININE 0.60 09/16/2021 0841   CALCIUM 9.2 09/16/2021 0841   PROT 7.8 09/16/2021 0841   ALBUMIN 4.7 09/16/2021 0841   AST 12 09/16/2021 0841   ALT 11 09/16/2021 0841   ALKPHOS 55 09/16/2021 0841   BILITOT 0.3 09/16/2021 0841   GFRNONAA >60 04/20/2020 0503   GFRAA >60 04/20/2020 0503   Component Ref Range & Units 1 mo ago  Salmonella/Shigella Screen  Final report   Stool Culture result 1 (RSASHR)  Comment   Comment: No Salmonella or Shigella recovered.  Campylobacter Culture  Final report   Stool Culture result 1 (CMPCXR)  Comment   Comment: No Campylobacter species isolated.  E coli, Shiga toxin Assay Negative Negative    Component 1 mo ago  MICRO NUMBER: [id number]   SPECIMEN QUALITY: Adequate   Source STOOL   STATUS: FINAL   CONCENTRATE RESULT: No ova or parasites seen   TRICHROME RESULT: No ova or parasites seen   COMMENT: Routine Ova and Parasite exam may not detect some parasites that occasionally cause diarrheal illness. Cryptosporidium Antigen and/or Cyclospora and Isospora Exam may be ordered to detect these parasites. One negative sample does not necessarily rule out   the presence of a parasitic infection.  For additional information, please refer to https://education.[facility].com/faq/FAQ203 (This link is being provided for informational/ educational purposes only.)    Component Ref Range & Units 1 mo ago  Fecal Occult Bld Negative Negative   Resulting Agency  [facility]            ASSESSMENT AND PLAN: 31 year old female with abrupt onset diarrhea lasting about 2 weeks over a month ago, with residual alterations in bowel habits, manifested by increased bowel frequency decreased stool consistency and sensation of incomplete evacuation.  No significant abdominal  pain, no blood in the stool, no weight loss.  This clinical picture seems most consistent with an acute infectious gastroenteritis, with subsequent altered bowel habits related to the infection (postinfectious IBS).  I do not believe the symptoms warrant an endoscopic evaluation.  The patient was mostly looking for reassurance.  I recommended that she resume her Metamucil every single day, and consider increasing it to twice daily if still having bothersome symptoms with once daily dosing.  She will follow-up with me in 6 to 8 weeks if her symptoms are not improving and we can talk about other interventions such as dietary modification that may help her symptoms.   Change in bowel habits -Take metamucil daily -Follow up in 6-8 weeks if no improvement  Bernese Doffing E. Candis Schatz, MD  [facility]   CC:  Mackie Pai, PA-C

## 2022-04-18 NOTE — Patient Instructions (Signed)
_______________________________________________________  If you are age 33 or older, your body mass index should be between 23-30. Your Body mass index is 24.8 kg/m. If this is out of the aforementioned range listed, please consider follow up with your Primary Care Provider.  If you are age 66 or younger, your body mass index should be between 19-25. Your Body mass index is 24.8 kg/m. If this is out of the aformentioned range listed, please consider follow up with your Primary Care Provider.   Metamucil daily -increase to twice daily if no change in stool.  The [place] [facility] providers would like to encourage you to use [facility] to communicate with providers for non-urgent requests or questions.  Due to long hold times on the telephone, sending your provider a message by [facility] may be a faster and more efficient way to get a response.  Please allow 48 business hours for a response.  Please remember that this is for non-urgent requests.   It was a pleasure to see you today!  Thank you for trusting me with your gastrointestinal care!

## 2022-04-21 ENCOUNTER — Other Ambulatory Visit (HOSPITAL_BASED_OUTPATIENT_CLINIC_OR_DEPARTMENT_OTHER): Payer: Self-pay

## 2022-04-22 ENCOUNTER — Other Ambulatory Visit (HOSPITAL_BASED_OUTPATIENT_CLINIC_OR_DEPARTMENT_OTHER): Payer: Self-pay

## 2022-05-30 ENCOUNTER — Ambulatory Visit (INDEPENDENT_AMBULATORY_CARE_PROVIDER_SITE_OTHER): Payer: No Typology Code available for payment source | Admitting: Family Medicine

## 2022-05-30 ENCOUNTER — Other Ambulatory Visit (HOSPITAL_BASED_OUTPATIENT_CLINIC_OR_DEPARTMENT_OTHER): Payer: Self-pay

## 2022-05-30 ENCOUNTER — Encounter: Payer: Self-pay | Admitting: Family Medicine

## 2022-05-30 VITALS — BP 120/78 | HR 66 | Temp 98.0°F | Ht 59.0 in | Wt 122.0 lb

## 2022-05-30 DIAGNOSIS — J209 Acute bronchitis, unspecified: Secondary | ICD-10-CM | POA: Diagnosis not present

## 2022-05-30 MED ORDER — AZITHROMYCIN 250 MG PO TABS
ORAL_TABLET | ORAL | 0 refills | Status: DC
  Filled 2022-05-30: qty 6, 5d supply, fill #0

## 2022-05-30 MED ORDER — PREDNISONE 20 MG PO TABS
40.0000 mg | ORAL_TABLET | Freq: Every day | ORAL | 0 refills | Status: AC
  Filled 2022-05-30: qty 10, 5d supply, fill #0

## 2022-05-30 NOTE — Patient Instructions (Addendum)
Continue to push fluids, practice good hand hygiene, and cover your mouth if you cough.  If you start having fevers, shaking or shortness of breath, seek immediate care.  OK to take Tylenol 1000 mg (2 extra strength tabs) or 975 mg (3 regular strength tabs) every 6 hours as needed.  Wait 2-3 days and if no better then take the azithromycin.   Let us know if you need anything.

## 2022-05-30 NOTE — Progress Notes (Signed)
Chief Complaint  Patient presents with   Nasal Congestion   Cough    Coughing more at night    Rupert Stacks here for URI complaints.  Duration: 8 days  Associated symptoms: sinus congestion, rhinorrhea, wheezing, and coughing Denies: sinus pain, itchy watery eyes, ear pain, ear drainage, sore throat, shortness of breath, myalgia, and fevers, N/V/D Treatment to date: Theraflu, [place] contacts: Yes; son who is in daycare Tested neg for covid x 2.   Past Medical History:  Diagnosis Date   Gestational diabetes    not diabetic. last a1c prediabetic range.   Uterine fibroid     Objective BP 120/78 (BP Location: Left Arm, Patient Position: Sitting, Cuff Size: Normal)   Pulse 66   Temp 98 F (36.7 C) (Oral)   Ht '4\' 11"'$  (1.499 m)   Wt 122 lb (55.3 kg)   SpO2 98%   BMI 24.64 kg/m  General: Awake, alert, appears stated age HEENT: AT, [place], ears patent b/l and TM's neg, nares patent w/o discharge, pharynx pink and without exudates, MMM Neck: No masses or asymmetry Heart: RRR Lungs: CTAB, no accessory muscle use Psych: Age appropriate judgment and insight, normal mood and affect  Acute wheezy bronchitis - Plan: predniSONE (DELTASONE) 20 MG tablet  5 d pred burst 40 mg/d. If no improvement in 2-3 d, will take zpak. Continue to push fluids, practice good hand hygiene, cover mouth when coughing. F/u prn. If starting to experience fevers, shaking, or shortness of breath, seek immediate care. Pt voiced understanding and agreement to the plan.  [place], DO 05/30/22 9:28 AM

## 2022-06-02 ENCOUNTER — Encounter: Payer: Self-pay | Admitting: Gastroenterology

## 2022-06-05 ENCOUNTER — Other Ambulatory Visit: Payer: Self-pay

## 2022-06-05 DIAGNOSIS — R194 Change in bowel habit: Secondary | ICD-10-CM

## 2022-06-05 DIAGNOSIS — R109 Unspecified abdominal pain: Secondary | ICD-10-CM

## 2022-06-12 ENCOUNTER — Other Ambulatory Visit: Payer: No Typology Code available for payment source

## 2022-06-16 ENCOUNTER — Other Ambulatory Visit: Payer: No Typology Code available for payment source

## 2022-06-16 DIAGNOSIS — R195 Other fecal abnormalities: Secondary | ICD-10-CM

## 2022-06-16 DIAGNOSIS — R109 Unspecified abdominal pain: Secondary | ICD-10-CM

## 2022-06-16 DIAGNOSIS — R194 Change in bowel habit: Secondary | ICD-10-CM

## 2022-06-18 LAB — H. PYLORI ANTIGEN, STOOL: H pylori Ag, Stl: NEGATIVE

## 2022-06-19 NOTE — Progress Notes (Signed)
Crystal Joseph,  Your H. Pylori stool test was negative.  If you are still having bothersome upper GI symptoms despite taking acid suppressive medication, we can schedule you for an EGD.  You can also make a follow up clinic visit so that we can discuss this further.  Let us what you prefer.

## 2022-08-05 ENCOUNTER — Ambulatory Visit: Payer: Self-pay | Admitting: Gastroenterology

## 2022-08-11 ENCOUNTER — Other Ambulatory Visit (HOSPITAL_BASED_OUTPATIENT_CLINIC_OR_DEPARTMENT_OTHER): Payer: Self-pay

## 2022-08-11 ENCOUNTER — Ambulatory Visit: Payer: 59 | Admitting: Gastroenterology

## 2022-08-11 ENCOUNTER — Encounter: Payer: Self-pay | Admitting: Gastroenterology

## 2022-08-11 VITALS — BP 118/68 | HR 83 | Ht 59.0 in | Wt 123.1 lb

## 2022-08-11 DIAGNOSIS — K219 Gastro-esophageal reflux disease without esophagitis: Secondary | ICD-10-CM | POA: Diagnosis not present

## 2022-08-11 DIAGNOSIS — R194 Change in bowel habit: Secondary | ICD-10-CM

## 2022-08-11 MED ORDER — OMEPRAZOLE 20 MG PO CPDR
20.0000 mg | DELAYED_RELEASE_CAPSULE | Freq: Every day | ORAL | 1 refills | Status: DC
  Filled 2022-08-11: qty 30, 30d supply, fill #0
  Filled 2022-09-17: qty 30, 30d supply, fill #1

## 2022-08-11 NOTE — Patient Instructions (Signed)
_______________________________________________________  If you are age 34 or older, your body mass index should be between 23-30. Your Body mass index is 24.87 kg/m. If this is out of the aforementioned range listed, please consider follow up with your Primary Care Provider.  If you are age 48 or younger, your body mass index should be between 19-25. Your Body mass index is 24.87 kg/m. If this is out of the aformentioned range listed, please consider follow up with your Primary Care Provider.   We have sent the following medications to your pharmacy for you to pick up at your convenience: Omeprazole 20 mg daily.  Continue metamucil daily.  The [facility] providers would like to encourage you to use [facility] to communicate with providers for non-urgent requests or questions.  Due to long hold times on the telephone, sending your provider a message by [facility] may be a faster and more efficient way to get a response.  Please allow 48 business hours for a response.  Please remember that this is for non-urgent requests.   It was a pleasure to see you today!  Thank you for trusting me with your gastrointestinal care!    Scott E.Candis Schatz, MD

## 2022-08-11 NOTE — Progress Notes (Signed)
HPI : Crystal Joseph is a very pleasant 32 year old female with no significant chronic medical history, who I initially saw in September of this year for change in bowel habits which followed an acute diarrheal illness.  She was having ongoing symptoms of frequent loose stools with urgency and sensation of incomplete evacuation.  She was recommended to take Metamucil on a daily basis. Shortly after that, she developed newer symptoms of upper abdominal discomfort, bloating and symptoms of chest pressure and globus sensation with throat clearing.  She was advised to start taking Pepcid, and this has been helpful.  An H. pylori stool antigen was negative. She reports that she has symptoms of globus sensation multiple days per week.  This is noticed more during the day and morning.  She denies significant nocturnal symptoms.  She denies symptoms of heartburn, but does report acid regurgitation on rare occasion.  She denies dysphagia.  She reports a bloating discomfort in her upper abdomen, but denies significant abdominal pain.  No nausea or vomiting. Her weight has been stable.  She does not drink alcohol or smoke cigarettes.  She does not drink coffee and rarely drinks carbonated beverages.  She does tend to eat large meals late in the evening.  She does note that when she eats heavy/fatty foods, this tends to worsen her symptoms.  She reports improvement of her bowel habits, currently taking Metamucil every 3 to 4 days.  Every once while she will continue to have loose stool with urgency or sensation that  Of note, she is traveling to [place] in late February and will be returning in April.  Past Medical History:  Diagnosis Date   Gestational diabetes    not diabetic. last a1c prediabetic range.   Uterine fibroid      Past Surgical History:  Procedure Laterality Date   CESAREAN SECTION N/A 04/19/2020   Procedure: CESAREAN SECTION;  Surgeon: Osborne Oman, MD;  Location: [facility];  Service:  Obstetrics;  Laterality: N/A;   Family History  Problem Relation Age of Onset   Diabetes Father    Social History   Tobacco Use   Smoking status: Never   Smokeless tobacco: Never  Vaping Use   Vaping Use: Never used  Substance Use Topics   Alcohol use: Never   Drug use: Never   Current Outpatient Medications  Medication Sig Dispense Refill   loratadine (CLARITIN) 10 MG tablet Take 1 tablet (10 mg total) by mouth daily. 100 tablet 11   Multiple Vitamin (MULTIVITAMIN) capsule Take 1 capsule by mouth daily.     No current facility-administered medications for this visit.   No Known Allergies   Review of Systems: All systems reviewed and negative except where noted in HPI.    No results found.  Physical Exam: BP 118/68 (BP Location: Left Arm, Patient Position: Sitting, Cuff Size: Normal)   Pulse 83   Ht '4\' 11"'$  (1.499 m)   Wt 123 lb 2 oz (55.8 kg)   SpO2 97%   BMI 24.87 kg/m  Constitutional: Pleasant,well-developed, [place] female in no acute distress. HEENT: Normocephalic and atraumatic. Conjunctivae are normal. No scleral icterus. Extremities: no edema Neurological: Alert and oriented to person place and time. Skin: Skin is warm and dry. No rashes noted. Psychiatric: Normal mood and affect. Behavior is normal.  CBC    Component Value Date/Time   WBC 5.7 09/16/2021 0841   RBC 4.47 09/16/2021 0841   HGB 13.0 09/16/2021 0841   HGB 11.5 02/10/2020  0909   HCT 39.8 09/16/2021 0841   HCT 34.6 02/10/2020 0909   PLT 302.0 09/16/2021 0841   PLT 259 02/10/2020 0909   MCV 89.1 09/16/2021 0841   MCV 92 02/10/2020 0909   MCH 29.5 04/20/2020 0503   MCHC 32.5 09/16/2021 0841   RDW 14.4 09/16/2021 0841   RDW 13.7 02/10/2020 0909   LYMPHSABS 1.9 09/16/2021 0841   LYMPHSABS 1.6 09/29/2019 1028   MONOABS 0.3 09/16/2021 0841   EOSABS 0.1 09/16/2021 0841   EOSABS 0.3 09/29/2019 1028   BASOSABS 0.0 09/16/2021 0841   BASOSABS 0.0 09/29/2019 1028    CMP      Component Value Date/Time   NA 139 09/16/2021 0841   K 4.8 09/16/2021 0841   CL 106 09/16/2021 0841   CO2 29 09/16/2021 0841   GLUCOSE 104 (H) 09/16/2021 0841   BUN 22 09/16/2021 0841   CREATININE 0.60 09/16/2021 0841   CALCIUM 9.2 09/16/2021 0841   PROT 7.8 09/16/2021 0841   ALBUMIN 4.7 09/16/2021 0841   AST 12 09/16/2021 0841   ALT 11 09/16/2021 0841   ALKPHOS 55 09/16/2021 0841   BILITOT 0.3 09/16/2021 0841   GFRNONAA >60 04/20/2020 0503   GFRAA >60 04/20/2020 0503     ASSESSMENT AND PLAN: 32 year old female with several months of globus sensation and chest/upper abdomen discomfort with bloating, partially improved with once daily Pepcid.  Suspect GERD as cause of her symptoms.  Will change to omeprazole 20 mg PO daily for 30 days.  If no improvement, consider EGD at that time. The patient states that she may be looking to get pregnant in the near future.  I informed her that we would probably switch her to a different medication if she does decide to try for another pregnancy. We discussed the pathophysiology of GERD and the principles of GERD management to include lifestyle modifications  such as dietary discretion (avoidance of alcohol, tobacco, caffeinated and carbonated beverages, spicy/greasy foods, citrus, peppermint/chocolate), weight loss if applicable, head of bed elevation andconsuming last meal of day within 3 hours of bedtime; pharmacologic options to include PPIs, H2RAs and OTC antacids; and finally surgical or endoscopic fundoplication.  GERD - Omeprazole 20 mg PO daily for 3-4 weeks - EGD if no improvement  Change in bowel habits following infectious enteritis -Take Metamucil daily indefinitely  Kinsie Belford E. Candis Schatz, MD [facility]   CC:  Mackie Pai, [place]

## 2022-09-05 ENCOUNTER — Encounter: Payer: Self-pay | Admitting: Gastroenterology

## 2022-09-17 ENCOUNTER — Ambulatory Visit (INDEPENDENT_AMBULATORY_CARE_PROVIDER_SITE_OTHER): Payer: 59 | Admitting: Medical

## 2022-09-17 ENCOUNTER — Encounter: Payer: Self-pay | Admitting: Medical

## 2022-09-17 ENCOUNTER — Other Ambulatory Visit (HOSPITAL_BASED_OUTPATIENT_CLINIC_OR_DEPARTMENT_OTHER): Payer: Self-pay

## 2022-09-17 VITALS — BP 116/74 | HR 70 | Temp 97.7°F | Resp 18 | Ht 59.0 in | Wt 119.0 lb

## 2022-09-17 DIAGNOSIS — Z113 Encounter for screening for infections with a predominantly sexual mode of transmission: Secondary | ICD-10-CM | POA: Diagnosis not present

## 2022-09-17 DIAGNOSIS — Z0000 Encounter for general adult medical examination without abnormal findings: Secondary | ICD-10-CM | POA: Diagnosis not present

## 2022-09-17 DIAGNOSIS — R739 Hyperglycemia, unspecified: Secondary | ICD-10-CM | POA: Diagnosis not present

## 2022-09-17 LAB — CBC WITH DIFFERENTIAL/PLATELET
Basophils Absolute: 0 10*3/uL (ref 0.0–0.1)
Basophils Relative: 0.7 % (ref 0.0–3.0)
Eosinophils Absolute: 0.1 10*3/uL (ref 0.0–0.7)
Eosinophils Relative: 1.5 % (ref 0.0–5.0)
HCT: 38.1 % (ref 36.0–46.0)
Hemoglobin: 12.5 g/dL (ref 12.0–15.0)
Lymphocytes Relative: 27.5 % (ref 12.0–46.0)
Lymphs Abs: 1.9 10*3/uL (ref 0.7–4.0)
MCHC: 32.8 g/dL (ref 30.0–36.0)
MCV: 88.7 fl (ref 78.0–100.0)
Monocytes Absolute: 0.4 10*3/uL (ref 0.1–1.0)
Monocytes Relative: 5.6 % (ref 3.0–12.0)
Neutro Abs: 4.4 10*3/uL (ref 1.4–7.7)
Neutrophils Relative %: 64.7 % (ref 43.0–77.0)
Platelets: 371 10*3/uL (ref 150.0–400.0)
RBC: 4.29 Mil/uL (ref 3.87–5.11)
RDW: 14 % (ref 11.5–15.5)
WBC: 6.8 10*3/uL (ref 4.0–10.5)

## 2022-09-17 LAB — LIPID PANEL
Cholesterol: 183 mg/dL (ref 0–200)
HDL: 58.5 mg/dL (ref >39.00)
LDL Cholesterol: 112 mg/dL — ABNORMAL HIGH (ref 0–99)
NonHDL: 124.25
Total CHOL/HDL Ratio: 3
Triglycerides: 61 mg/dL (ref 0.0–149.0)
VLDL: 12.2 mg/dL (ref 0.0–40.0)

## 2022-09-17 LAB — COMPREHENSIVE METABOLIC PANEL
ALT: 13 U/L (ref 0–35)
AST: 16 U/L (ref 0–37)
Albumin: 4.5 g/dL (ref 3.5–5.2)
Alkaline Phosphatase: 58 U/L (ref 39–117)
BUN: 12 mg/dL (ref 6–23)
CO2: 29 mEq/L (ref 19–32)
Calcium: 9.5 mg/dL (ref 8.4–10.5)
Chloride: 103 mEq/L (ref 96–112)
Creatinine, Ser: 0.58 mg/dL (ref 0.40–1.20)
GFR: 118.39 mL/min (ref >60.00)
Glucose, Bld: 96 mg/dL (ref 70–99)
Potassium: 4.4 mEq/L (ref 3.5–5.1)
Sodium: 138 mEq/L (ref 135–145)
Total Bilirubin: 0.5 mg/dL (ref 0.2–1.2)
Total Protein: 7.7 g/dL (ref 6.0–8.3)

## 2022-09-17 LAB — HEMOGLOBIN A1C: Hgb A1c MFr Bld: 5.9 % (ref 4.6–6.5)

## 2022-09-17 NOTE — Progress Notes (Signed)
Subjective:    Patient ID: Crystal Joseph, female    DOB: [birth year], 32 y.o.   MRN: [id number]  HPI Pt in for wellness exam. She is fasting.   Recent dad dx with thyroid cancer. Pt updates me dad doing well now. About to visit dad in [place]. Pt works for [employer]. Pt does not exercise regularly. Walks a lot at work. Has 50 month old boy. Recently eating better/less sugar. No alcohol use. No caffeine.  Denies fatigue.   Mild elevated sugar in past. Gestational diabetes.    Lmp- last week..   Review of Systems  Constitutional:  Negative for chills, fatigue and fever.  HENT:  Negative for dental problem and ear pain.   Respiratory:  Negative for cough, chest tightness, shortness of breath and wheezing.   Cardiovascular:  Negative for chest pain and palpitations.  Gastrointestinal:  Negative for abdominal pain, blood in stool and constipation.  Genitourinary:  Negative for dysuria, flank pain, frequency and hematuria.  Musculoskeletal:  Negative for back pain, joint swelling, myalgias and neck stiffness.  Skin:  Negative for rash.  Neurological:  Negative for seizures, syncope, facial asymmetry, speech difficulty, weakness and light-headedness.  Hematological:  Negative for adenopathy. Does not bruise/bleed easily.  Psychiatric/Behavioral:  Negative for agitation, behavioral problems and dysphoric mood. The patient is not nervous/anxious.     Past Medical History:  Diagnosis Date   Gestational diabetes    not diabetic. last a1c prediabetic range.   Uterine fibroid      Social History   Socioeconomic History   Marital status: Married    Spouse name: Not on file   Number of children: Not on file   Years of education: Not on file   Highest education level: Not on file  Occupational History   Occupation: nurse  Tobacco Use   Smoking status: Never   Smokeless tobacco: Never  Vaping Use   Vaping Use: Never used  Substance and Sexual Activity   Alcohol use:  Never   Drug use: Never   Sexual activity: Yes    Birth control/protection: None  Other Topics Concern   Not on file  Social History Narrative   Not on file   Social Determinants of Health   Financial Resource Strain: Not on file  Food Insecurity: Not on file  Transportation Needs: Not on file  Physical Activity: Not on file  Stress: Not on file  Social Connections: Not on file  Intimate Partner Violence: Not on file    Past Surgical History:  Procedure Laterality Date   CESAREAN SECTION N/A 04/19/2020   Procedure: CESAREAN SECTION;  Surgeon: Osborne Oman, MD;  Location: [place] [facility] ORS;  Service: Obstetrics;  Laterality: N/A;    Family History  Problem Relation Age of Onset   Diabetes Father     No Known Allergies  Current Outpatient Medications on File Prior to Visit  Medication Sig Dispense Refill   loratadine (CLARITIN) 10 MG tablet Take 1 tablet (10 mg total) by mouth daily. 100 tablet 11   Multiple Vitamin (MULTIVITAMIN) capsule Take 1 capsule by mouth daily.     omeprazole (PRILOSEC) 20 MG capsule Take 1 capsule (20 mg total) by mouth daily. 30 capsule 1   No current facility-administered medications on file prior to visit.    BP 116/74   Pulse 70   Temp 97.7 F (36.5 C)   Resp 18   Ht 4' 11"$  (1.499 m)   Wt 119 lb (54  kg)   SpO2 99%   BMI 24.04 kg/m        Objective:   Physical Exam  General Mental Status- Alert. General Appearance- Not in acute distress.   Skin General: Color- Normal Color. Moisture- Normal Moisture.  Neck Carotid Arteries- Normal color. Moisture- Normal Moisture. No carotid bruits. No JVD.  Chest and Lung Exam Auscultation: Breath Sounds:-Normal.  Cardiovascular Auscultation:Rythm- Regular. Murmurs & Other Heart Sounds:Auscultation of the heart reveals- No Murmurs.  Abdomen Inspection:-Inspeection Normal. Palpation/Percussion:Note:No mass. Palpation and Percussion of the abdomen reveal- Non Tender, Non Distended +  BS, no rebound or guarding.   Neurologic Cranial Nerve exam:- CN III-XII intact(No nystagmus), symmetric smile. Strength:- 5/5 equal and symmetric strength both upper and lower extremities.       Assessment & Plan:   Patient Instructions  For you wellness exam today I have ordered cbc, cmp, A1-C  and  lipid panel.  Std screen at pt request.  Vaccine given today.   Recommend exercise and healthy diet.  We will let you know lab results as they come in.  Follow up date appointment will be determined after lab review.        Mackie Pai, PA-C

## 2022-09-17 NOTE — Patient Instructions (Addendum)
For you wellness exam today I have ordered cbc, cmp, A1-C  and  lipid panel.  Std screen at pt request.  Vaccine given today.   Recommend exercise and healthy diet.  We will let you know lab results as they come in.  Follow up date appointment will be determined after lab review.

## 2022-09-18 LAB — HIV ANTIBODY (ROUTINE TESTING W REFLEX): HIV 1&2 Ab, 4th Generation: NONREACTIVE

## 2022-11-17 ENCOUNTER — Other Ambulatory Visit (HOSPITAL_BASED_OUTPATIENT_CLINIC_OR_DEPARTMENT_OTHER): Payer: Self-pay

## 2022-11-17 ENCOUNTER — Other Ambulatory Visit: Payer: Self-pay

## 2022-11-17 ENCOUNTER — Encounter: Payer: Self-pay | Admitting: Gastroenterology

## 2022-11-17 MED ORDER — OMEPRAZOLE 20 MG PO CPDR
20.0000 mg | DELAYED_RELEASE_CAPSULE | Freq: Every day | ORAL | 1 refills | Status: DC
  Filled 2022-11-17: qty 30, 30d supply, fill #0
  Filled 2023-01-04: qty 30, 30d supply, fill #1

## 2022-12-30 IMAGING — MG DIGITAL DIAGNOSTIC BILAT W/ TOMO W/ CAD
6 of 10 series · 6 of 30 positions shown · non-contrast
Comparison: None.

CLINICAL DATA: 30-year-old presenting with a possible palpable lump
involving the lower outer retroareolar RIGHT breast. This is the
patient's initial baseline mammogram.

Patient states no family history of breast cancer.
EXAM:
DIGITAL DIAGNOSTIC BILATERAL MAMMOGRAM WITH TOMOSYNTHESIS AND CAD;
ULTRASOUND RIGHT BREAST LIMITED
TECHNIQUE: Bilateral digital diagnostic mammography and breast tomosynthesis
was performed. The images were evaluated with computer-aided
detection.; Targeted ultrasound examination of the right breast was
performed

[L MLO synth-2D]
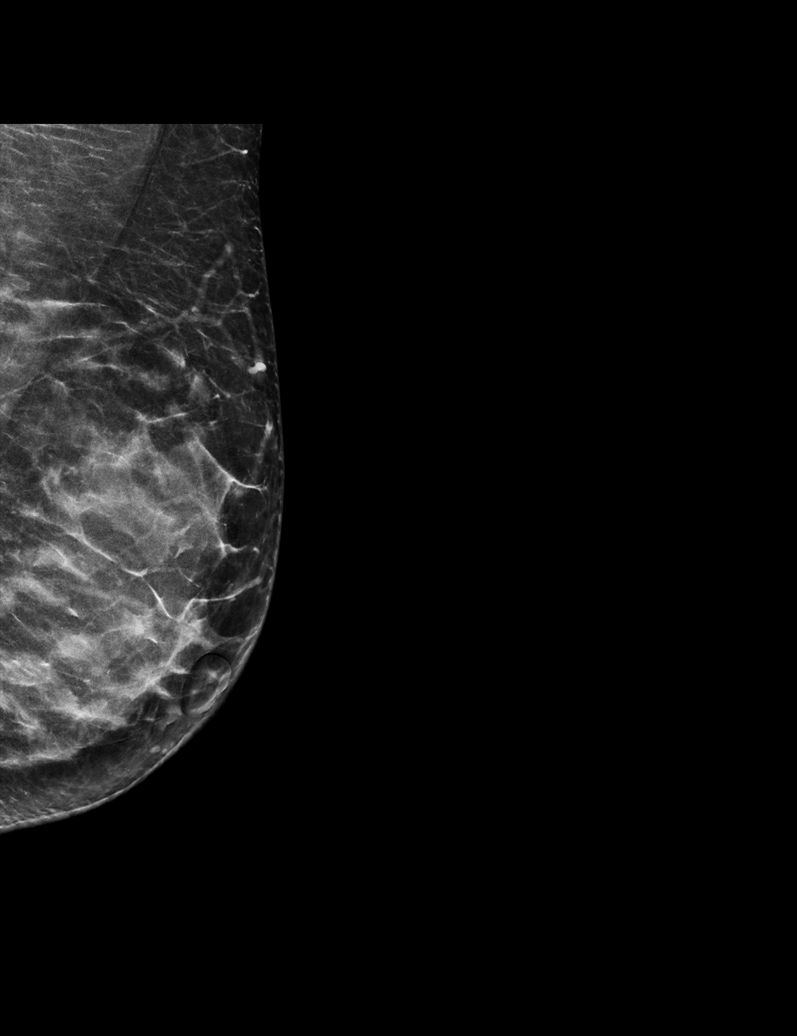

[L CC synth-2D]
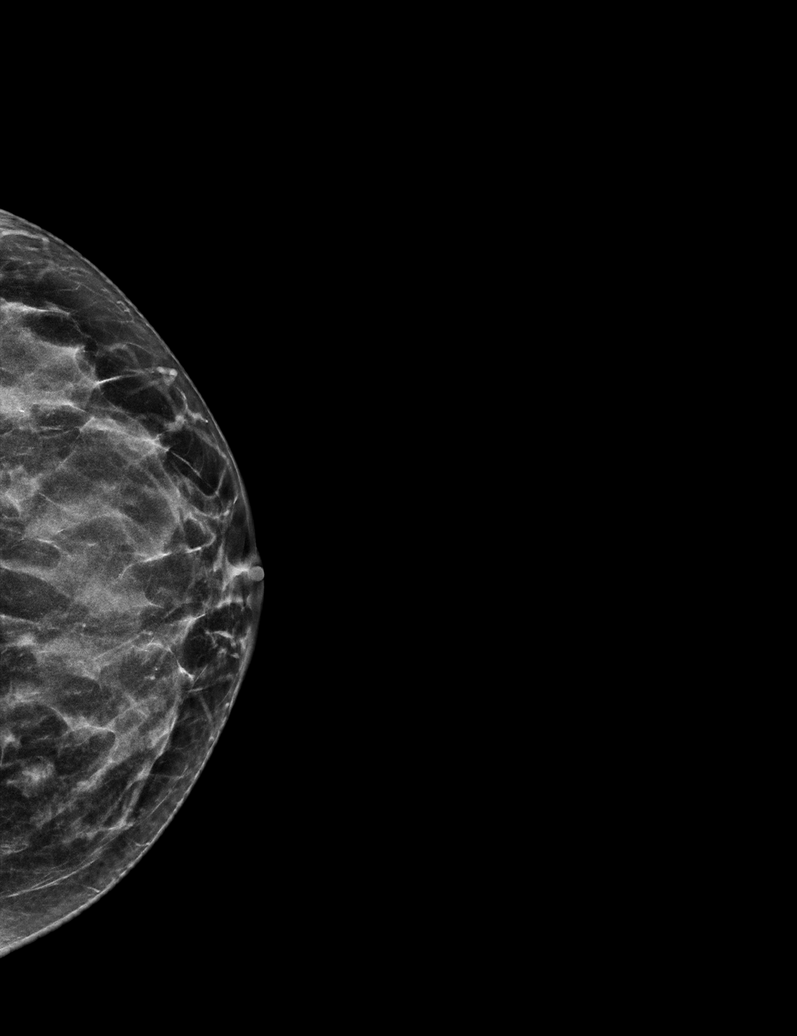

[R TAN synth-2D]
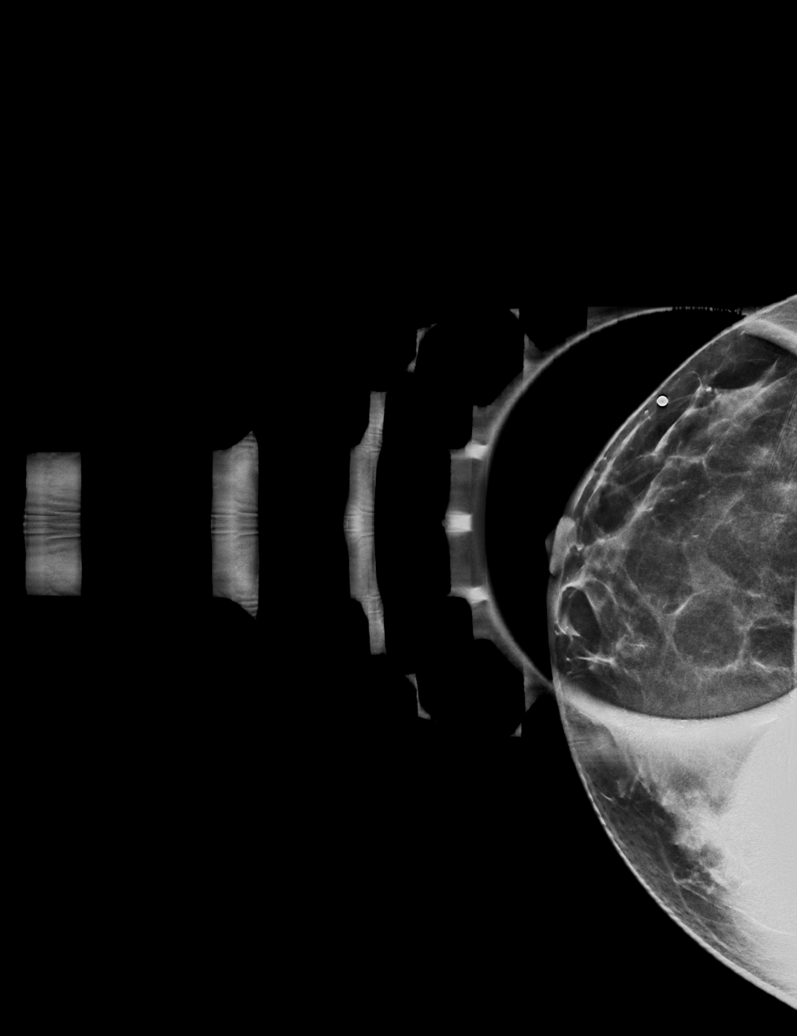

[R MLO synth-2D]
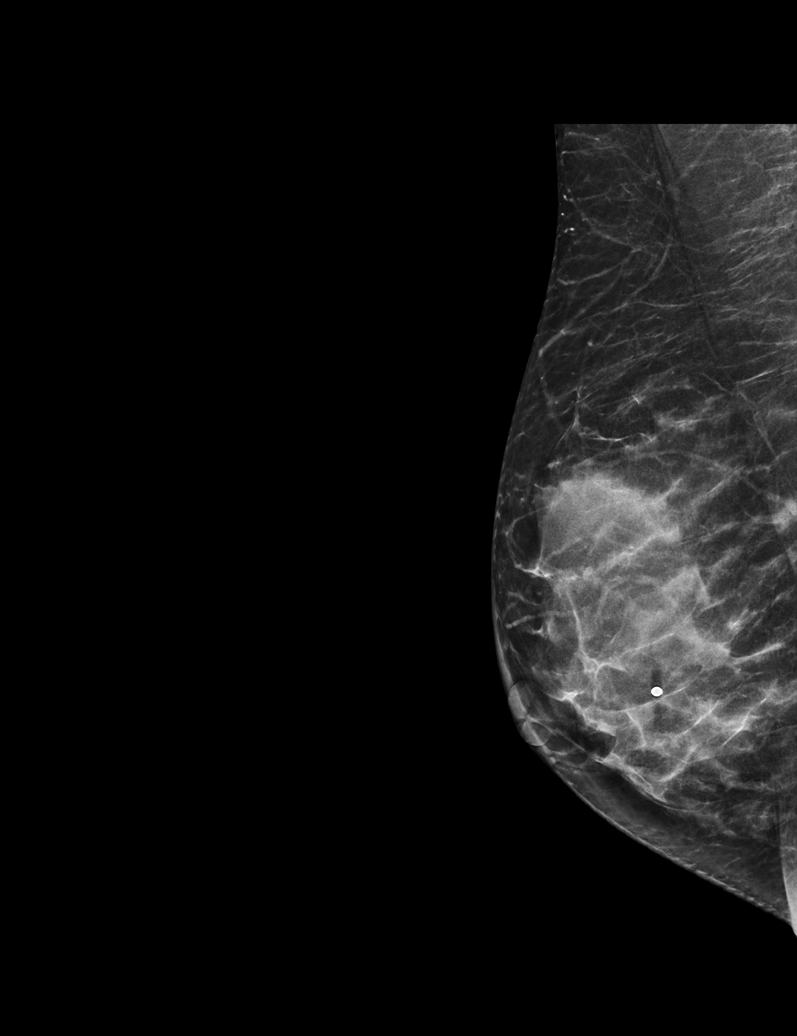

[R CC synth-2D]
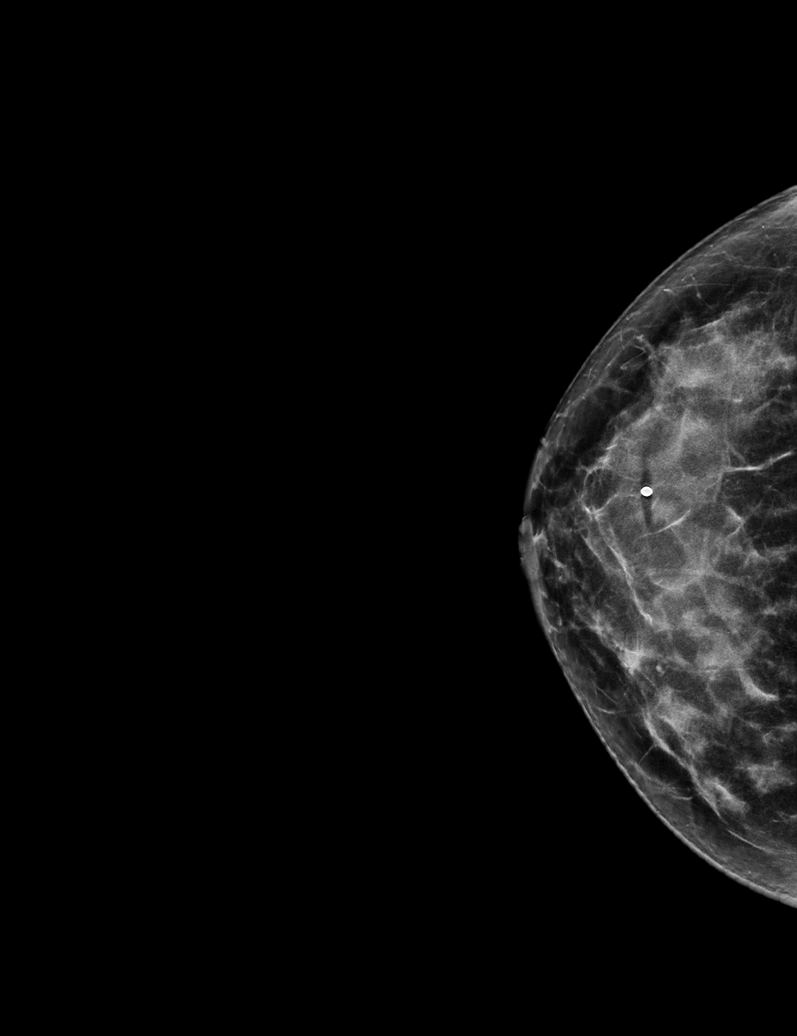

[R CC tomo · tomo slice 27/53.0]
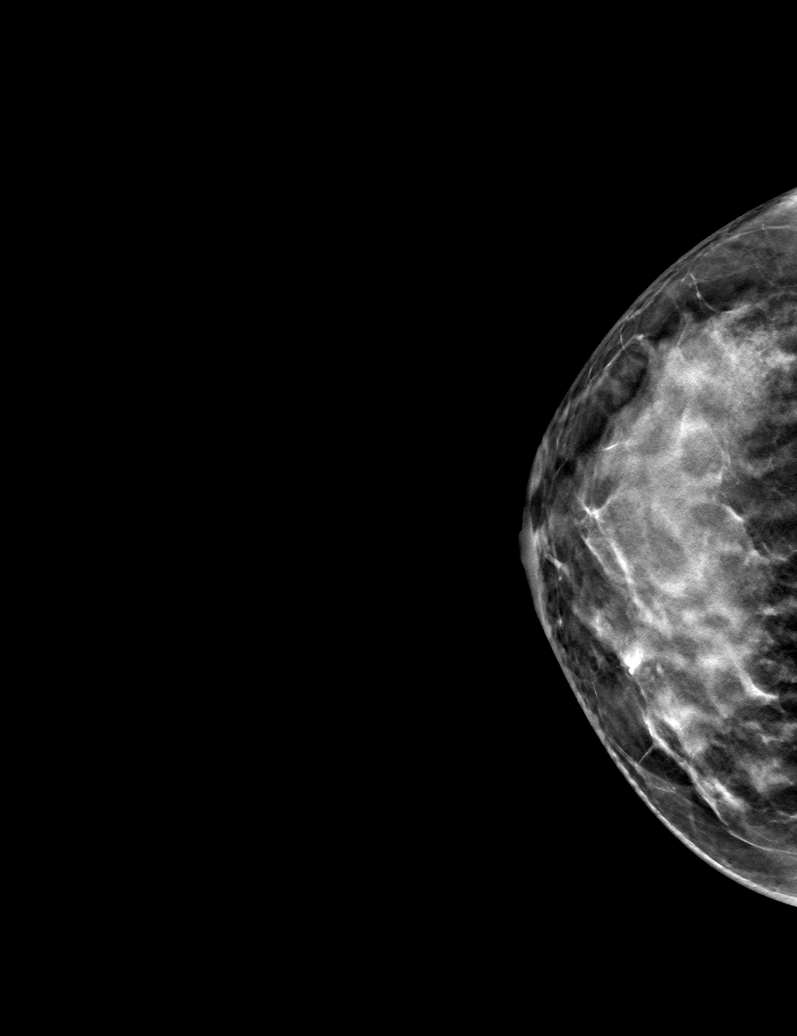

[6 of 30 positions shown; findings below may reference images not displayed]

ACR Breast Density Category d: The breast tissue is extremely dense,
which lowers the sensitivity of mammography.
FINDINGS: Full field CC and MLO views of both breasts and a spot compression
tangential view of the palpable concern in the RIGHT breast were
obtained.

RIGHT: No findings suspicious for malignancy. No mammographic
abnormality in the area of palpable concern in the lower outer
retroareolar location; normal dense fibroglandular tissue is present
in this location.

Targeted ultrasound is performed in the area of palpable concern,
demonstrating a prominent subcutaneous fat lobule at the 7 o'clock
position 3 cm from the nipple. Normal fibroglandular tissue is
present in this location as well. The costal cartilage for an
anterior RIGHT rib underlies this location. No cyst, solid mass or
abnormal acoustic shadowing is identified.

LEFT: No findings suspicious for malignancy.

On correlative physical examination, there is a mobile fluctuant
palpable lump in the lower outer periareolar RIGHT breast
corresponding to what the patient is feeling and corresponding to
the prominent fat lobule on ultrasound.
IMPRESSION: 1. No mammographic or sonographic evidence of malignancy involving
the RIGHT breast.
2. No mammographic evidence of malignancy involving the LEFT breast.

RECOMMENDATION:
Screening mammogram at age 38 unless there are persistent or
subsequent clinical concerns. (Code:T7-V-FGE)

I have discussed the findings and recommendations with the patient.
If applicable, a reminder letter will be sent to the patient
regarding the next appointment.

BI-RADS CATEGORY  1: Negative.

## 2023-02-26 ENCOUNTER — Encounter: Payer: Self-pay | Admitting: Family Medicine

## 2023-02-26 ENCOUNTER — Other Ambulatory Visit (HOSPITAL_COMMUNITY)
Admission: RE | Admit: 2023-02-26 | Discharge: 2023-02-26 | Disposition: A | Discharge status: Home / Self Care | Payer: 59 | Source: Ambulatory Visit | Attending: Family Medicine | Admitting: Family Medicine

## 2023-02-26 ENCOUNTER — Ambulatory Visit (INDEPENDENT_AMBULATORY_CARE_PROVIDER_SITE_OTHER): Payer: 59 | Admitting: Family Medicine

## 2023-02-26 VITALS — BP 111/71 | HR 77 | Wt 120.0 lb

## 2023-02-26 DIAGNOSIS — Z01419 Encounter for gynecological examination (general) (routine) without abnormal findings: Secondary | ICD-10-CM

## 2023-02-26 DIAGNOSIS — N920 Excessive and frequent menstruation with regular cycle: Secondary | ICD-10-CM

## 2023-02-26 NOTE — Progress Notes (Signed)
ANNUAL EXAM Patient name: Crystal Joseph MRN [id number]  Date of birth: 1991/01/16 Chief Complaint:   Annual Exam  History of Present Illness:   Crystal Joseph is a 32 y.o.  G49P1001  female  being seen today for a routine annual exam.  Current complaints: heavy periods over the past 2-3 months. Some mild generalized discomfort during sex. No vaginal dryness. No abnormal discharge.   Patient's last menstrual period was 02/17/2023 (exact date).    Last pap 2022. Results were: NILM w/ HRHPV negative. H/O abnormal pap: no Last mammogram:      02/26/2023    8:43 AM 09/17/2022    7:55 AM 08/06/2021    9:45 AM 02/20/2020    5:17 PM  Depression screen PHQ 2/9  Decreased Interest 0 0 0 0  Down, Depressed, Hopeless 0 0 0 0  PHQ - 2 Score 0 0 0 0  Altered sleeping 1     Tired, decreased energy 0     Change in appetite 0     Feeling bad or failure about yourself  0     Trouble concentrating 0     Moving slowly or fidgety/restless 0     Suicidal thoughts 0     PHQ-9 Score 1           02/26/2023    8:43 AM  GAD 7 : Generalized Anxiety Score  Nervous, Anxious, on Edge 0  Control/stop worrying 0  Worry too much - different things 0  Trouble relaxing 0  Restless 0  Easily annoyed or irritable 0  Afraid - awful might happen 0  Total GAD 7 Score 0     Review of Systems:   Pertinent items are noted in HPI Denies any headaches, blurred vision, fatigue, shortness of breath, chest pain, abdominal pain, abnormal vaginal discharge/itching/odor/irritation, problems with periods, bowel movements, urination, or intercourse unless otherwise stated above. Pertinent History Reviewed:  Reviewed past medical,surgical, social and family history.  Reviewed problem list, medications and allergies. Physical Assessment:   Vitals:   02/26/23 0839  BP: 111/71  Pulse: 77  Weight: 120 lb (54.4 kg)  Body mass index is 24.24 kg/m.        Physical Examination:   General appearance - well appearing, and in  no distress  Mental status - alert, oriented to person, place, and time  Psych:  She has a normal mood and affect  Skin - warm and dry, normal color, no suspicious lesions noted  Chest - effort normal, all lung fields clear to auscultation bilaterally  Heart - normal rate and regular rhythm  Neck:  midline trachea, no thyromegaly or nodules  Breasts - breasts appear normal, no suspicious masses, no skin or nipple changes or axillary nodes  Abdomen - soft, nontender, nondistended, no masses or organomegaly  Pelvic - VULVA: normal appearing vulva with no masses, tenderness or lesions  VAGINA: normal appearing vagina with normal color and discharge, no lesions  CERVIX: normal appearing cervix without discharge or lesions, no CMT  Thin prep pap is done with HR HPV cotesting  UTERUS: uterus is felt to be normal size, shape, consistency and nontender   ADNEXA: No adnexal masses or tenderness noted.  Extremities:  No swelling or varicosities noted  Chaperone present for exam  Assessment & Plan:  1. Well woman exam with routine gynecological exam PAP today  2. Menorrhagia with regular cycle Will check [place]. Has history of IUD. - US PELVIC COMPLETE WITH TRANSVAGINAL; Future  Labs/procedures today:    No orders of the defined types were placed in this encounter.   Meds: No orders of the defined types were placed in this encounter.   Follow-up: No follow-ups on file.  Levie Heritage, DO 02/26/2023 9:06 AM

## 2023-02-26 NOTE — Addendum Note (Signed)
Addended by: Lorelle Gibbs L on: 02/26/2023 10:35 AM   Modules accepted: Orders

## 2023-03-03 ENCOUNTER — Ambulatory Visit (HOSPITAL_BASED_OUTPATIENT_CLINIC_OR_DEPARTMENT_OTHER)
Admission: RE | Admit: 2023-03-03 | Discharge: 2023-03-03 | Disposition: A | Discharge status: Home / Self Care | Payer: 59 | Source: Ambulatory Visit | Attending: Family Medicine | Admitting: Family Medicine

## 2023-03-03 DIAGNOSIS — N920 Excessive and frequent menstruation with regular cycle: Secondary | ICD-10-CM | POA: Insufficient documentation

## 2023-03-07 ENCOUNTER — Other Ambulatory Visit: Payer: Self-pay | Admitting: Gastroenterology

## 2023-03-09 ENCOUNTER — Other Ambulatory Visit (HOSPITAL_COMMUNITY): Payer: Self-pay

## 2023-03-09 ENCOUNTER — Other Ambulatory Visit: Payer: Self-pay

## 2023-03-09 MED ORDER — OMEPRAZOLE 20 MG PO CPDR
20.0000 mg | DELAYED_RELEASE_CAPSULE | Freq: Every day | ORAL | 1 refills | Status: DC
  Filled 2023-03-09 – 2023-03-18 (×2): qty 30, 30d supply, fill #0
  Filled 2023-04-15: qty 30, 30d supply, fill #1

## 2023-03-10 ENCOUNTER — Other Ambulatory Visit: Payer: Self-pay

## 2023-03-13 ENCOUNTER — Other Ambulatory Visit: Payer: Self-pay

## 2023-03-18 ENCOUNTER — Other Ambulatory Visit (HOSPITAL_COMMUNITY): Payer: Self-pay

## 2023-03-25 ENCOUNTER — Encounter: Payer: Self-pay | Admitting: Family Medicine

## 2023-03-25 ENCOUNTER — Ambulatory Visit (INDEPENDENT_AMBULATORY_CARE_PROVIDER_SITE_OTHER): Payer: 59 | Admitting: Family Medicine

## 2023-03-25 ENCOUNTER — Other Ambulatory Visit (HOSPITAL_BASED_OUTPATIENT_CLINIC_OR_DEPARTMENT_OTHER): Payer: Self-pay

## 2023-03-25 VITALS — BP 96/52 | HR 70 | Temp 98.0°F | Ht 59.0 in | Wt 122.0 lb

## 2023-03-25 DIAGNOSIS — J069 Acute upper respiratory infection, unspecified: Secondary | ICD-10-CM | POA: Diagnosis not present

## 2023-03-25 MED ORDER — LORATADINE 10 MG PO TABS
10.0000 mg | ORAL_TABLET | Freq: Every day | ORAL | 3 refills | Status: DC
Start: 2023-03-25 — End: 2023-04-17
  Filled 2023-03-25: qty 100, 100d supply, fill #0

## 2023-03-25 MED ORDER — FLUTICASONE PROPIONATE 50 MCG/ACT NA SUSP
2.0000 | Freq: Every day | NASAL | 6 refills | Status: DC
Start: 2023-03-25 — End: 2023-04-17
  Filled 2023-03-25: qty 16, 30d supply, fill #0
  Filled 2023-03-27: qty 16, 30d supply, fill #1

## 2023-03-25 MED ORDER — BENZONATATE 200 MG PO CAPS
200.0000 mg | ORAL_CAPSULE | Freq: Two times a day (BID) | ORAL | 0 refills | Status: DC | PRN
Start: 2023-03-25 — End: 2023-04-17
  Filled 2023-03-25: qty 20, 10d supply, fill #0

## 2023-03-25 MED ORDER — HYDROCOD POLI-CHLORPHE POLI ER 10-8 MG/5ML PO SUER
5.0000 mL | Freq: Every evening | ORAL | 0 refills | Status: AC | PRN
Start: 2023-03-25 — End: 2023-03-30
  Filled 2023-03-25: qty 25, 5d supply, fill #0

## 2023-03-25 MED ORDER — GUAIFENESIN ER 600 MG PO TB12
1200.0000 mg | ORAL_TABLET | Freq: Two times a day (BID) | ORAL | 0 refills | Status: DC
Start: 2023-03-25 — End: 2023-04-17
  Filled 2023-03-25: qty 40, 10d supply, fill #0

## 2023-03-25 NOTE — Progress Notes (Signed)
Acute Office Visit  Subjective:     Patient ID: Crystal Joseph, female    DOB: [birth year], 32 y.o.   MRN: [id number]  Chief Complaint  Patient presents with   Sore Throat   Cough     Patient is in today for cough, URI.  Discussed the use of AI scribe software for clinical note transcription with the patient, who gave verbal consent to proceed.  History of Present Illness   The patient, with a history of upper respiratory symptoms for over a week, initially presented with a sore throat that resolved after a few days. Following the resolution of the sore throat, the patient developed a cough and rhinorrhea, which she attempted to manage with salt water rinses and gargles. The cough was particularly bothersome at night, with episodes of intense coughing fits lasting up to ten minutes. Despite the severity of the cough, the patient denied experiencing shortness of breath or fever.  The patient described the cough as productive, with occasional expectoration of light greenish mucus. She also reported a sensation of postnasal drainage. Despite these symptoms, the patient had not taken any over-the-counter medications or undergone COVID-19 testing.  Three days prior to the consultation, the patient started taking Claritin to manage the postnasal drainage. She denied any other new medications or changes in her health status. The patient had not sought any other medical advice or treatment for her symptoms prior to this consultation.                All review of systems negative except what is listed in the HPI      Objective:    BP (!) 96/52   Pulse 70   Temp 98 F (36.7 C) (Oral)   Ht 4\' 11"  (1.499 m)   Wt 122 lb (55.3 kg)   LMP 02/17/2023 (Exact Date)   SpO2 100%   BMI 24.64 kg/m    Physical Exam Vitals reviewed.  Constitutional:      Appearance: Normal appearance.  HENT:     Head: Normocephalic and atraumatic.     Right Ear: Tympanic membrane normal.     Left Ear:  Tympanic membrane normal.     Nose: Congestion present.     Mouth/Throat:     Mouth: Mucous membranes are moist.     Pharynx: Oropharynx is clear. No posterior oropharyngeal erythema.     Comments: Cobblestoning/PND Cardiovascular:     Rate and Rhythm: Normal rate and regular rhythm.     Heart sounds: Normal heart sounds.  Pulmonary:     Effort: Pulmonary effort is normal.     Breath sounds: Normal breath sounds. No wheezing, rhonchi or rales.  Musculoskeletal:     Cervical back: Normal range of motion and neck supple.  Skin:    General: Skin is warm and dry.  Neurological:     Mental Status: She is alert and oriented to person, place, and time.  Psychiatric:        Mood and Affect: Mood normal.        Behavior: Behavior normal.        Thought Content: Thought content normal.        Judgment: Judgment normal.     No results found for any visits on 03/25/23.      Assessment & Plan:   Problem List Items Addressed This Visit   None Visit Diagnoses     Viral URI with cough    -  Primary   Relevant Medications  guaiFENesin (MUCINEX) 600 MG 12 hr tablet   benzonatate (TESSALON) 200 MG capsule   chlorpheniramine-HYDROcodone (TUSSIONEX) 10-8 MG/5ML   loratadine (CLARITIN) 10 MG tablet   fluticasone (FLONASE) 50 MCG/ACT nasal spray     Persistent cough with postnasal drainage, likely post-viral cough syndrome. No fever, shortness of breath, or sinus pressure. Noted greenish sputum. -Continue Claritin daily until symptoms resolve. -Start Flonase nasal spray daily until symptoms resolve. -Start Mucinex  -Start PRN cough syrup at night for cough control. -Start PRN Tessalon for daytime cough control. -Encouraged hydration and use of honey and lemon for throat comfort and cough. -Return if symptoms persist or worsen for possible antibiotic therapy.        Meds ordered this encounter  Medications   guaiFENesin (MUCINEX) 600 MG 12 hr tablet    Sig: Take 2 tablets (1,200  mg total) by mouth 2 (two) times daily.    Dispense:  40 tablet    Refill:  0    Order Specific Question:   Supervising Provider    Answer:   Danise Edge A [[id number]]   benzonatate (TESSALON) 200 MG capsule    Sig: Take 1 capsule (200 mg total) by mouth 2 (two) times daily as needed for cough.    Dispense:  20 capsule    Refill:  0    Order Specific Question:   Supervising Provider    Answer:   Danise Edge A [[id number]]   chlorpheniramine-HYDROcodone (TUSSIONEX) 10-8 MG/5ML    Sig: Take 5 mLs by mouth at bedtime as needed for up to 5 days.    Dispense:  25 mL    Refill:  0    Order Specific Question:   Supervising Provider    Answer:   Danise Edge A [[id number]]   loratadine (CLARITIN) 10 MG tablet    Sig: Take 1 tablet (10 mg total) by mouth daily.    Dispense:  100 tablet    Refill:  3    Order Specific Question:   Supervising Provider    Answer:   Danise Edge A [[id number]]   fluticasone (FLONASE) 50 MCG/ACT nasal spray    Sig: Place 2 sprays into both nostrils daily.    Dispense:  16 g    Refill:  6    Order Specific Question:   Supervising Provider    Answer:   Danise Edge A [[id number]]    Return if symptoms worsen or fail to improve.  Clayborne Dana, NP

## 2023-03-27 ENCOUNTER — Other Ambulatory Visit: Payer: Self-pay

## 2023-04-01 ENCOUNTER — Ambulatory Visit: Payer: 59 | Admitting: Medical

## 2023-04-17 ENCOUNTER — Other Ambulatory Visit (HOSPITAL_BASED_OUTPATIENT_CLINIC_OR_DEPARTMENT_OTHER): Payer: Self-pay

## 2023-04-17 ENCOUNTER — Ambulatory Visit (HOSPITAL_BASED_OUTPATIENT_CLINIC_OR_DEPARTMENT_OTHER)
Admission: RE | Admit: 2023-04-17 | Discharge: 2023-04-17 | Disposition: A | Discharge status: Home / Self Care | Payer: 59 | Source: Ambulatory Visit | Attending: Medical | Admitting: Medical

## 2023-04-17 ENCOUNTER — Ambulatory Visit (INDEPENDENT_AMBULATORY_CARE_PROVIDER_SITE_OTHER): Payer: 59 | Admitting: Medical

## 2023-04-17 VITALS — BP 98/63 | HR 76 | Temp 97.7°F | Resp 18 | Ht 59.0 in | Wt 121.2 lb

## 2023-04-17 DIAGNOSIS — R059 Cough, unspecified: Secondary | ICD-10-CM | POA: Diagnosis not present

## 2023-04-17 DIAGNOSIS — J309 Allergic rhinitis, unspecified: Secondary | ICD-10-CM

## 2023-04-17 MED ORDER — BENZONATATE 100 MG PO CAPS
100.0000 mg | ORAL_CAPSULE | Freq: Three times a day (TID) | ORAL | 0 refills | Status: DC | PRN
  Filled 2023-04-17: qty 30, 10d supply, fill #0

## 2023-04-17 MED ORDER — LEVOCETIRIZINE DIHYDROCHLORIDE 5 MG PO TABS
5.0000 mg | ORAL_TABLET | Freq: Every evening | ORAL | 3 refills | Status: DC
  Filled 2023-04-17: qty 30, 30d supply, fill #0

## 2023-04-17 MED ORDER — AZITHROMYCIN 250 MG PO TABS
ORAL_TABLET | ORAL | 0 refills | Status: AC
Start: 2023-04-17 — End: 2023-04-22
  Filled 2023-04-17: qty 6, 5d supply, fill #0

## 2023-04-17 NOTE — Progress Notes (Signed)
Subjective:    Patient ID: Crystal Joseph, female    DOB: Dec 30, 1990, 32 y.o.   MRN: [id number]  HPI  Discussed the use of AI scribe software for clinical note transcription with the patient, who gave verbal consent to proceed.  History of Present Illness   The patient, with possible seasonal allergies, presents with a persistent cough and thick mucus production for the past month. Despite treatment with Tessalon, Mucinex, Claritin, and Flonase, the cough persists, albeit less frequently. The patient reports a hoarse voice that started two days ago and occasional coughing, but not as severe as before. They also report a sensation of thick mucus in the throat and a stuffy nose, with green-colored nasal discharge.  The patient denies any fever, chills, loss of taste, wheezing, shortness of breath, or sinus pressure. They have been using Flonase and saline for nasal congestion, which provides temporary relief. The patient has been taking Claritin as needed for stuffiness and continues to use Flonase PRN.  The patient stopped taking Mucinex and Tessalon a week after the last office visit. They also report that they have not taken any cough medicine for the past two weeks. The patient denies having any seasonal allergies but takes Claritin as needed for stuffiness.        Review of Systems See hpi.    Objective:   Physical Exam General- No acute distress. Pleasant patient. Neck- Full range of motion, no jvd Lungs- Clear, even and unlabored. Heart- regular rate and rhythm. Neurologic- CNII- XII grossly intact.  Heent- moderate nasal congestion, faint pharynx redness. No sinus pressure. Hoarse voice.      Assessment & Plan:   Patient Instructions  Upper Respiratory Infection vs bronchitis(considered walking pneumonia) Persistent cough and hoarseness for over a month with recent change in sputum color to green. No fever, chills, or shortness of breath. No improvement with Tessalon, Mucinex,  Claritin, and Flonase. -Start Azithromycin for 5 days. In light of one month illness. -Order chest X-ray. -Continue Flonase as needed. -Switch Claritin to Xyzal at night. -Prescribe Benzonatate for cough, to be used every 8 hours as needed.  Allergic Rhinitis Possible contribution to symptoms, currently on Claritin and Flonase as needed. -Switch Claritin to Xyzal at night. -Continue Flonase as needed.   Follow up date to be determined after xray review. Also want update on how you feel in one week.  Esperanza Richters, PA-C

## 2023-04-17 NOTE — Patient Instructions (Addendum)
Upper Respiratory Infection vs bronchitis(considered walking pneumonia) Persistent cough and hoarseness for over a month with recent change in sputum color to green. No fever, chills, or shortness of breath. No improvement with Tessalon, Mucinex, Claritin, and Flonase. -Start Azithromycin for 5 days. In light of one month illness. -Order chest X-ray. -Continue Flonase as needed. -Switch Claritin to Xyzal at night. -Prescribe Benzonatate for cough, to be used every 8 hours as needed.  Allergic Rhinitis Possible contribution to symptoms, currently on Claritin and Flonase as needed. -Switch Claritin to Xyzal at night. -Continue Flonase as needed.   Follow up date to be determined after xray review. Also want update on how you feel in one

## 2023-05-27 ENCOUNTER — Other Ambulatory Visit: Payer: Self-pay | Admitting: Gastroenterology

## 2023-05-28 ENCOUNTER — Other Ambulatory Visit (HOSPITAL_COMMUNITY): Payer: Self-pay

## 2023-05-28 MED ORDER — OMEPRAZOLE 20 MG PO CPDR
20.0000 mg | DELAYED_RELEASE_CAPSULE | Freq: Every day | ORAL | 1 refills | Status: DC
  Filled 2023-05-28: qty 30, 30d supply, fill #0
  Filled 2023-07-03: qty 30, 30d supply, fill #1

## 2023-07-04 ENCOUNTER — Other Ambulatory Visit (HOSPITAL_COMMUNITY): Payer: Self-pay

## 2023-07-07 ENCOUNTER — Ambulatory Visit (INDEPENDENT_AMBULATORY_CARE_PROVIDER_SITE_OTHER): Payer: 59 | Admitting: Family Medicine

## 2023-07-07 ENCOUNTER — Other Ambulatory Visit (HOSPITAL_COMMUNITY): Payer: Self-pay

## 2023-07-07 ENCOUNTER — Other Ambulatory Visit (HOSPITAL_BASED_OUTPATIENT_CLINIC_OR_DEPARTMENT_OTHER): Payer: Self-pay

## 2023-07-07 ENCOUNTER — Other Ambulatory Visit: Payer: Self-pay

## 2023-07-07 ENCOUNTER — Encounter: Payer: Self-pay | Admitting: Family Medicine

## 2023-07-07 VITALS — BP 116/58 | HR 70 | Temp 97.8°F | Ht 59.0 in | Wt 125.0 lb

## 2023-07-07 DIAGNOSIS — J069 Acute upper respiratory infection, unspecified: Secondary | ICD-10-CM | POA: Diagnosis not present

## 2023-07-07 MED ORDER — GUAIFENESIN ER 600 MG PO TB12
1200.0000 mg | ORAL_TABLET | Freq: Two times a day (BID) | ORAL | 0 refills | Status: DC
Start: 2023-07-07 — End: 2023-10-06
  Filled 2023-07-07: qty 20, 5d supply, fill #0

## 2023-07-07 MED ORDER — HYDROCOD POLI-CHLORPHE POLI ER 10-8 MG/5ML PO SUER
5.0000 mL | Freq: Two times a day (BID) | ORAL | 0 refills | Status: AC | PRN
Start: 2023-07-07 — End: 2023-07-12
  Filled 2023-07-07: qty 50, 5d supply, fill #0

## 2023-07-07 MED ORDER — AMOXICILLIN-POT CLAVULANATE 875-125 MG PO TABS
1.0000 | ORAL_TABLET | Freq: Two times a day (BID) | ORAL | 0 refills | Status: AC
Start: 2023-07-07 — End: 2023-07-14
  Filled 2023-07-07: qty 14, 7d supply, fill #0

## 2023-07-07 NOTE — Progress Notes (Signed)
Acute Office Visit  Subjective:     Patient ID: Crystal Joseph, female    DOB: Apr 18, 1991, 32 y.o.   MRN: [id number]  Chief Complaint  Patient presents with   Cough     Patient is in today for cough, URI symptoms.   Discussed the use of AI scribe software for clinical note transcription with the patient, who gave verbal consent to proceed.  History of Present Illness   The patient, with a recent history of a viral illness, presents with a week-long history of worsening upper respiratory symptoms. She reports a sore throat, cough, and voice loss. The cough is primarily dry but can produce tan phlegm during the day. The patient also reports experiencing chills and changes in taste. The symptoms have been fluctuating in severity, with a noted worsening over the past couple of days.  Previously, the patient was treated with a Z-Pak in September, which provided relief. Currently, she has been managing symptoms with Theraflu, honey, hot tea, and Claritin, taken inconsistently based on symptom severity. She also reports using Flonase nasal spray and Delsym for cough, with uncertain efficacy.  The patient denies any pain or difficulty breathing, fever, or significant fatigue. She also denies any achiness. She reports a runny and stuffy nose, but no headache. The patient has a young child in daycare who was recently ill, suggesting a possible source of infection.        All review of systems negative except what is listed in the HPI      Objective:    BP (!) 116/58   Pulse 70   Temp 97.8 F (36.6 C) (Oral)   Ht 4\' 11"  (1.499 m)   Wt 125 lb (56.7 kg)   SpO2 100%   BMI 25.25 kg/m    Physical Exam Vitals reviewed.  Constitutional:      Appearance: Normal appearance.  HENT:     Head: Normocephalic and atraumatic.     Right Ear: Tympanic membrane normal.     Left Ear: Tympanic membrane normal.     Nose: Congestion present.     Mouth/Throat:     Mouth: Mucous membranes are moist.      Pharynx: Oropharynx is clear. No posterior oropharyngeal erythema.     Comments: Cobblestoning/PND Cardiovascular:     Rate and Rhythm: Normal rate and regular rhythm.     Heart sounds: Normal heart sounds.  Pulmonary:     Effort: Pulmonary effort is normal.     Breath sounds: Normal breath sounds. No wheezing, rhonchi or rales.  Musculoskeletal:     Cervical back: Normal range of motion and neck supple.  Skin:    General: Skin is warm and dry.  Neurological:     Mental Status: She is alert and oriented to person, place, and time.  Psychiatric:        Mood and Affect: Mood normal.        Behavior: Behavior normal.        Thought Content: Thought content normal.        Judgment: Judgment normal.     No results found for any visits on 07/07/23.      Assessment & Plan:   Problem List Items Addressed This Visit   None Visit Diagnoses     Upper respiratory tract infection, unspecified type    -  Primary   Relevant Medications   chlorpheniramine-HYDROcodone (TUSSIONEX) 10-8 MG/5ML   amoxicillin-clavulanate (AUGMENTIN) 875-125 MG tablet   guaiFENesin (MUCINEX) 600 MG 12  hr tablet         Upper Respiratory Infection Persistent symptoms for about a week including sore throat, cough, voice changes, and chills. No fever, dyspnea, or significant fatigue. No improvement with supportive measures including Theraflu, Claritin, and Flonase. -Continue supportive measures including Theraflu, Claritin, and Flonase. -Start Mucinex twice daily to help clear phlegm. -Prescribe stronger cough syrup for symptomatic relief, cautioning about potential drowsiness. -If no improvement or worsening symptoms in 2 days, start prescribed antibiotic. -Return to clinic if symptoms significantly worsen, if high fevers develop, or if coughing up blood.        Meds ordered this encounter  Medications   chlorpheniramine-HYDROcodone (TUSSIONEX) 10-8 MG/5ML    Sig: Take 5 mLs by mouth every 12  (twelve) hours as needed for up to 5 days.    Dispense:  50 mL    Refill:  0    Order Specific Question:   Supervising Provider    Answer:   Danise Edge A [[id number]]   amoxicillin-clavulanate (AUGMENTIN) 875-125 MG tablet    Sig: Take 1 tablet by mouth 2 (two) times daily for 7 days.    Dispense:  14 tablet    Refill:  0    Order Specific Question:   Supervising Provider    Answer:   Danise Edge A [[id number]]   guaiFENesin (MUCINEX) 600 MG 12 hr tablet    Sig: Take 2 tablets (1,200 mg total) by mouth 2 (two) times daily.    Dispense:  20 tablet    Refill:  0    Order Specific Question:   Supervising Provider    Answer:   Danise Edge A [[id number]]    Return if symptoms worsen or fail to improve.  Clayborne Dana, NP

## 2023-07-08 ENCOUNTER — Ambulatory Visit: Payer: 59 | Admitting: Medical

## 2023-07-15 ENCOUNTER — Other Ambulatory Visit (HOSPITAL_BASED_OUTPATIENT_CLINIC_OR_DEPARTMENT_OTHER): Payer: Self-pay

## 2023-07-15 ENCOUNTER — Other Ambulatory Visit: Payer: Self-pay

## 2023-07-15 ENCOUNTER — Encounter: Payer: Self-pay | Admitting: Gastroenterology

## 2023-07-15 MED ORDER — OMEPRAZOLE 20 MG PO CPDR
20.0000 mg | DELAYED_RELEASE_CAPSULE | Freq: Every day | ORAL | 3 refills | Status: AC
  Filled 2023-07-15: qty 30, 30d supply, fill #0
  Filled 2023-08-31: qty 30, 30d supply, fill #1

## 2023-07-20 ENCOUNTER — Other Ambulatory Visit (HOSPITAL_BASED_OUTPATIENT_CLINIC_OR_DEPARTMENT_OTHER): Payer: Self-pay

## 2023-07-20 ENCOUNTER — Ambulatory Visit (INDEPENDENT_AMBULATORY_CARE_PROVIDER_SITE_OTHER): Payer: 59 | Admitting: Medical

## 2023-07-20 VITALS — BP 108/62 | HR 80 | Temp 98.0°F | Resp 18 | Ht 59.0 in | Wt 125.0 lb

## 2023-07-20 DIAGNOSIS — R058 Other specified cough: Secondary | ICD-10-CM

## 2023-07-20 DIAGNOSIS — J40 Bronchitis, not specified as acute or chronic: Secondary | ICD-10-CM | POA: Diagnosis not present

## 2023-07-20 DIAGNOSIS — J3489 Other specified disorders of nose and nasal sinuses: Secondary | ICD-10-CM

## 2023-07-20 MED ORDER — BENZONATATE 100 MG PO CAPS
100.0000 mg | ORAL_CAPSULE | Freq: Three times a day (TID) | ORAL | 0 refills | Status: DC | PRN
  Filled 2023-07-20: qty 30, 10d supply, fill #0

## 2023-07-20 MED ORDER — FLUTICASONE PROPIONATE 50 MCG/ACT NA SUSP
2.0000 | Freq: Every day | NASAL | 1 refills | Status: AC
  Filled 2023-07-20: qty 16, 30d supply, fill #0
  Filled 2024-05-17: qty 16, 30d supply, fill #1

## 2023-07-20 MED ORDER — AZITHROMYCIN 250 MG PO TABS
ORAL_TABLET | ORAL | 0 refills | Status: AC
  Filled 2023-07-20: qty 6, 5d supply, fill #0

## 2023-07-20 NOTE — Progress Notes (Signed)
Subjective:    Patient ID: Crystal Joseph, female    DOB: Oct 21, 1990, 32 y.o.   MRN: [id number]  HPI  Discussed the use of AI scribe software for clinical note transcription with the patient, who gave verbal consent to proceed.  History of Present Illness   The patient, with a history of upper respiratory infection, presents with a month-long illness that began with a hoarse voice and later developed into a cough and runny nose, particularly at night. Despite completing a course of antibiotics, the patient reports persistent symptoms, including cough and nasal congestion. She describes the phlegm as thick and lodged in her throat, and her voice has not returned to normal.  The patient's condition improved after starting antibiotics, but not completely. She reports a greenish sputum and a sensation of mucus stuck in the upper chest, necessitating frequent coughing. She denies any chest congestion, fever, chills, sweats, shortness of breath, or wheezing.  The patient also reports intermittent sinus pressure and a runny nose, which she has been managing with Claritin. She describes the mucus as greenish, both when coughing and blowing her nose. The patient's symptoms began before Thanksgiving with a sore throat, which improved after taking Theraflu for two days.         Review of Systems  Constitutional:  Negative for chills and fever.  HENT:  Positive for congestion and sinus pressure. Negative for sinus pain.   Respiratory:  Positive for cough. Negative for choking and wheezing.   Cardiovascular:  Negative for chest pain and palpitations.  Gastrointestinal:  Negative for abdominal pain.  Musculoskeletal:  Negative for back pain and joint swelling.  Neurological:  Negative for dizziness, syncope, weakness and light-headedness.  Hematological:  Negative for adenopathy. Does not bruise/bleed easily.  Psychiatric/Behavioral:  Negative for behavioral problems and decreased concentration.    Past  Medical History:  Diagnosis Date   Gestational diabetes    not diabetic. last a1c prediabetic range.   Uterine fibroid      Social History   Socioeconomic History   Marital status: Married    Spouse name: Not on file   Number of children: Not on file   Years of education: Not on file   Highest education level: Bachelor's degree (e.g., BA, AB, BS)  Occupational History   Occupation: nurse  Tobacco Use   Smoking status: Never   Smokeless tobacco: Never  Vaping Use   Vaping status: Never Used  Substance and Sexual Activity   Alcohol use: Never   Drug use: Never   Sexual activity: Yes    Birth control/protection: None  Other Topics Concern   Not on file  Social History Narrative   Not on file   Social Drivers of Health   Financial Resource Strain: Low Risk  (07/19/2023)   Overall Financial Resource Strain (CARDIA)    Difficulty of Paying Living Expenses: Not hard at all  Food Insecurity: No Food Insecurity (07/19/2023)   Hunger Vital Sign    Worried About Running Out of Food in the Last Year: Never true    Ran Out of Food in the Last Year: Never true  Transportation Needs: No Transportation Needs (07/19/2023)   PRAPARE - Administrator, Civil Service (Medical): No    Lack of Transportation (Non-Medical): No  Physical Activity: Insufficiently Active (07/19/2023)   Exercise Vital Sign    Days of Exercise per Week: 3 days    Minutes of Exercise per Session: 30 min  Stress: No Stress Concern  Present (07/19/2023)   [employer] of Occupational Health - Occupational Stress Questionnaire    Feeling of Stress : Only a little  Social Connections: Socially Integrated (07/19/2023)   Social Connection and Isolation Panel [NHANES]    Frequency of Communication with Friends and Family: More than three times a week    Frequency of Social Gatherings with Friends and Family: More than three times a week    Attends Religious Services: More than 4 times per year     Active Member of [employer] or Organizations: Yes    Attends Banker Meetings: More than 4 times per year    Marital Status: Married  Catering manager Violence: Unknown (11/07/2021)   Received from [employer], [facility]   HITS    Physically Hurt: Not on file    Insult or Talk Down To: Not on file    Threaten Physical Harm: Not on file    Scream or Curse: Not on file    Past Surgical History:  Procedure Laterality Date   CESAREAN SECTION N/A 04/19/2020   Procedure: CESAREAN SECTION;  Surgeon: Tereso Newcomer, MD;  Location: [facility] LD ORS;  Service: Obstetrics;  Laterality: N/A;    Family History  Problem Relation Age of Onset   Diabetes Father     No Known Allergies  Current Outpatient Medications on File Prior to Visit  Medication Sig Dispense Refill   guaiFENesin (MUCINEX) 600 MG 12 hr tablet Take 2 tablets (1,200 mg total) by mouth 2 (two) times daily. 20 tablet 0   Multiple Vitamin (MULTIVITAMIN) capsule Take 1 capsule by mouth daily.     Omega-3 Fatty Acids (FISH OIL PO) Take by mouth.     omeprazole (PRILOSEC) 20 MG capsule Take 1 capsule (20 mg total) by mouth daily. 30 capsule 3   No current facility-administered medications on file prior to visit.    BP 108/62   Pulse 80   Temp 98 F (36.7 C) (Oral)   Resp 18   Ht 4\' 11"  (1.499 m)   Wt 125 lb (56.7 kg)   SpO2 95%   BMI 25.25 kg/m         Objective:   Physical Exam  General Mental Status- Alert. General Appearance- Not in acute distress.   Skin General: Color- Normal Color. Moisture- Normal Moisture.  Neck Carotid Arteries- Normal color. Moisture- Normal Moisture. No carotid bruits. No JVD.  Chest and Lung Exam Auscultation: Breath Sounds:-CTA  Cardiovascular Auscultation:Rythm- RRR Murmurs & Other Heart Sounds:Auscultation of the heart reveals- No Murmurs.  Abdomen Inspection:-Inspeection Normal. Palpation/Percussion:Note:No mass. Palpation and Percussion of the abdomen reveal-  Non Tender, Non Distended + BS, no rebound or guarding.   Neurologic Cranial Nerve exam:- CN III-XII intact(No nystagmus), symmetric smile. Strength:- 5/5 equal and symmetric strength both upper and lower extremities.   Heent- nasal/sinus congestion described. No pain on palpation.    Assessment & Plan:   Assessment and Plan    Bronchitis and sinusitis following uri signs/symptoms late thanksiving. - Currently experiencing thick green mucus production and sensation of mucus in upper chest and intermittent sinus pressure. No fever, chills, sweats, or shortness of breath. -Start Azithromycin for 5 days. -Continue Flonase nasal spray. -Start Benzonatate for cough. -Optional chest X-ray if productive cough persists for more than a week. -Follow-up as needed and provide update via MyChart in 7 days.        Esperanza Richters, PA-C

## 2023-07-20 NOTE — Patient Instructions (Addendum)
Bronchitis and sinusitis following uri signs/symptoms since late thanksiving. - Currently experiencing thick green mucus production and sensation of mucus in upper chest and intermittent sinus pressure. No fever, chills, sweats, or shortness of breath. -Start Azithromycin for 5 days. -Continue Flonase nasal spray. -Start Benzonatate for cough. -Optional chest X-ray if productive cough persists for more than a week. -Follow-up as needed and provide update via MyChart in 7 days.

## 2023-07-24 ENCOUNTER — Encounter: Payer: Self-pay | Admitting: Medical

## 2023-08-31 ENCOUNTER — Other Ambulatory Visit (HOSPITAL_COMMUNITY): Payer: Self-pay

## 2023-08-31 ENCOUNTER — Other Ambulatory Visit: Payer: Self-pay

## 2023-09-21 ENCOUNTER — Ambulatory Visit (INDEPENDENT_AMBULATORY_CARE_PROVIDER_SITE_OTHER): Payer: Commercial Managed Care - PPO | Admitting: Medical

## 2023-09-21 ENCOUNTER — Encounter: Payer: Self-pay | Admitting: Medical

## 2023-09-21 VITALS — BP 120/68 | HR 77 | Resp 18 | Ht 59.0 in | Wt 124.0 lb

## 2023-09-21 DIAGNOSIS — Z0000 Encounter for general adult medical examination without abnormal findings: Secondary | ICD-10-CM | POA: Diagnosis not present

## 2023-09-21 DIAGNOSIS — R739 Hyperglycemia, unspecified: Secondary | ICD-10-CM | POA: Diagnosis not present

## 2023-09-21 DIAGNOSIS — Z113 Encounter for screening for infections with a predominantly sexual mode of transmission: Secondary | ICD-10-CM

## 2023-09-21 LAB — COMPREHENSIVE METABOLIC PANEL
ALT: 13 U/L (ref 0–35)
AST: 14 U/L (ref 0–37)
Albumin: 4.4 g/dL (ref 3.5–5.2)
Alkaline Phosphatase: 55 U/L (ref 39–117)
BUN: 14 mg/dL (ref 6–23)
CO2: 25 meq/L (ref 19–32)
Calcium: 9 mg/dL (ref 8.4–10.5)
Chloride: 106 meq/L (ref 96–112)
Creatinine, Ser: 0.57 mg/dL (ref 0.40–1.20)
GFR: 118.05 mL/min (ref >60.00)
Glucose, Bld: 110 mg/dL — ABNORMAL HIGH (ref 70–99)
Potassium: 4.5 meq/L (ref 3.5–5.1)
Sodium: 140 meq/L (ref 135–145)
Total Bilirubin: 0.2 mg/dL (ref 0.2–1.2)
Total Protein: 7.5 g/dL (ref 6.0–8.3)

## 2023-09-21 LAB — CBC WITH DIFFERENTIAL/PLATELET
Basophils Absolute: 0 10*3/uL (ref 0.0–0.1)
Basophils Relative: 0.4 % (ref 0.0–3.0)
Eosinophils Absolute: 0.1 10*3/uL (ref 0.0–0.7)
Eosinophils Relative: 1 % (ref 0.0–5.0)
HCT: 37.6 % (ref 36.0–46.0)
Hemoglobin: 12.5 g/dL (ref 12.0–15.0)
Lymphocytes Relative: 25.3 % (ref 12.0–46.0)
Lymphs Abs: 1.9 10*3/uL (ref 0.7–4.0)
MCHC: 33.2 g/dL (ref 30.0–36.0)
MCV: 88.9 fL (ref 78.0–100.0)
Monocytes Absolute: 0.4 10*3/uL (ref 0.1–1.0)
Monocytes Relative: 4.9 % (ref 3.0–12.0)
Neutro Abs: 5.2 10*3/uL (ref 1.4–7.7)
Neutrophils Relative %: 68.4 % (ref 43.0–77.0)
Platelets: 330 10*3/uL (ref 150.0–400.0)
RBC: 4.23 Mil/uL (ref 3.87–5.11)
RDW: 14.3 % (ref 11.5–15.5)
WBC: 7.6 10*3/uL (ref 4.0–10.5)

## 2023-09-21 LAB — HEMOGLOBIN A1C: Hgb A1c MFr Bld: 5.9 % (ref 4.6–6.5)

## 2023-09-21 LAB — LIPID PANEL
Cholesterol: 165 mg/dL (ref 0–200)
HDL: 55.8 mg/dL (ref >39.00)
LDL Cholesterol: 95 mg/dL (ref 0–99)
NonHDL: 109
Total CHOL/HDL Ratio: 3
Triglycerides: 70 mg/dL (ref 0.0–149.0)
VLDL: 14 mg/dL (ref 0.0–40.0)

## 2023-09-21 NOTE — Patient Instructions (Addendum)
For you wellness exam today I have ordered cbc, cmp, A1c, hiv  and  lipid panel..  Vaccine up to date.  Up to date on papsmear  Recommend exercise and healthy diet.  We will let you know lab results as they come in.  Follow up date appointment will be determined after lab review.    Preventive Care 22-35 Years Old, Female Preventive care refers to lifestyle choices and visits with your health care provider that can promote health and wellness. Preventive care visits are also called wellness exams. What can I expect for my preventive care visit? Counseling During your preventive care visit, your health care provider may ask about your: Medical history, including: Past medical problems. Family medical history. Pregnancy history. Current health, including: Menstrual cycle. Method of birth control. Emotional well-being. Home life and relationship well-being. Sexual activity and sexual health. Lifestyle, including: Alcohol, nicotine or tobacco, and drug use. Access to firearms. Diet, exercise, and sleep habits. Work and work Astronomer. Sunscreen use. Safety issues such as seatbelt and bike helmet use. Physical exam Your health care provider may check your: Height and weight. These may be used to calculate your BMI (body mass index). BMI is a measurement that tells if you are at a healthy weight. Waist circumference. This measures the distance around your waistline. This measurement also tells if you are at a healthy weight and may help predict your risk of certain diseases, such as type 2 diabetes and high blood pressure. Heart rate and blood pressure. Body temperature. Skin for abnormal spots. What immunizations do I need?  Vaccines are usually given at various ages, according to a schedule. Your health care provider will recommend vaccines for you based on your age, medical history, and lifestyle or other factors, such as travel or where you work. What tests do I  need? Screening Your health care provider may recommend screening tests for certain conditions. This may include: Pelvic exam and Pap test. Lipid and cholesterol levels. Diabetes screening. This is done by checking your blood sugar (glucose) after you have not eaten for a while (fasting). Hepatitis B test. Hepatitis C test. HIV (human immunodeficiency virus) test. STI (sexually transmitted infection) testing, if you are at risk. BRCA-related cancer screening. This may be done if you have a family history of breast, ovarian, tubal, or peritoneal cancers. Talk with your health care provider about your test results, treatment options, and if necessary, the need for more tests. Follow these instructions at home: Eating and drinking  Eat a healthy diet that includes fresh fruits and vegetables, whole grains, lean protein, and low-fat dairy products. Take vitamin and mineral supplements as recommended by your health care provider. Do not drink alcohol if: Your health care provider tells you not to drink. You are pregnant, may be pregnant, or are planning to become pregnant. If you drink alcohol: Limit how much you have to 0-1 drink a day. Know how much alcohol is in your drink. In the [place] one drink equals one 12 oz bottle of beer (355 mL), one 5 oz glass of wine (148 mL), or one 1 oz glass of hard liquor (44 mL). Lifestyle Brush your teeth every morning and night with fluoride toothpaste. Floss one time each day. Exercise for at least 30 minutes 5 or more days each week. Do not use any products that contain nicotine or tobacco. These products include cigarettes, chewing tobacco, and vaping devices, such as e-cigarettes. If you need help quitting, ask your health care provider. Do  not use drugs. If you are sexually active, practice safe sex. Use a condom or other form of protection to prevent STIs. If you do not wish to become pregnant, use a form of birth control. If you plan to become  pregnant, see your health care provider for a prepregnancy visit. Find healthy ways to manage stress, such as: Meditation, yoga, or listening to music. Journaling. Talking to a trusted person. Spending time with friends and family. Minimize exposure to UV radiation to reduce your risk of skin cancer. Safety Always wear your seat belt while driving or riding in a vehicle. Do not drive: If you have been drinking alcohol. Do not ride with someone who has been drinking. If you have been using any mind-altering substances or drugs. While texting. When you are tired or distracted. Wear a helmet and other protective equipment during sports activities. If you have firearms in your house, make sure you follow all gun safety procedures. Seek help if you have been physically or sexually abused. What's next? Go to your health care provider once a year for an annual wellness visit. Ask your health care provider how often you should have your eyes and teeth checked. Stay up to date on all vaccines. This information is not intended to replace advice given to you by your health care provider. Make sure you discuss any questions you have with your health care provider. Document Revised: 01/16/2021 Document Reviewed: 01/16/2021 Elsevier Patient Education  2024 [employer].

## 2023-09-21 NOTE — Progress Notes (Signed)
Subjective:    Patient ID: Crystal Joseph, female    DOB: 01/13/1991, 33 y.o.   MRN: [id number]  HPI  Pt in for wellness exam. She is fasting.   Recent dad dx with thyroid cancer. Pt updates me dad doing well now. About to visit dad in [place]. Pt works for [employer]. Pt does not exercise regularly. Walks a lot at work. Has boy that is doing well. Recently eating better/less sugar. No alcohol use. No caffeine.   Denies fatigue.   Mild elevated sugar in past. Gestational diabetes.    Lmp- 1 week ago.   Review of Systems  Constitutional:  Negative for chills, fatigue and fever.  HENT:  Negative for congestion and ear pain.   Respiratory:  Negative for cough, chest tightness and wheezing.   Cardiovascular:  Negative for chest pain and palpitations.  Gastrointestinal:  Negative for abdominal pain, blood in stool, constipation, nausea and rectal pain.  Genitourinary:  Negative for dysuria and frequency.  Musculoskeletal:  Negative for back pain and joint swelling.  Neurological:  Negative for dizziness, speech difficulty, weakness and light-headedness.  Hematological:  Negative for adenopathy. Does not bruise/bleed easily.  Psychiatric/Behavioral:  Negative for confusion.     Past Medical History:  Diagnosis Date   Gestational diabetes    not diabetic. last a1c prediabetic range.   Uterine fibroid      Social History   Socioeconomic History   Marital status: Married    Spouse name: Not on file   Number of children: Not on file   Years of education: Not on file   Highest education level: Bachelor's degree (e.g., BA, AB, BS)  Occupational History   Occupation: nurse  Tobacco Use   Smoking status: Never   Smokeless tobacco: Never  Vaping Use   Vaping status: Never Used  Substance and Sexual Activity   Alcohol use: Never   Drug use: Never   Sexual activity: Yes    Birth control/protection: None  Other Topics Concern   Not on file  Social History Narrative    Not on file   Social Drivers of Health   Financial Resource Strain: Low Risk  (07/19/2023)   Overall Financial Resource Strain (CARDIA)    Difficulty of Paying Living Expenses: Not hard at all  Food Insecurity: No Food Insecurity (07/19/2023)   Hunger Vital Sign    Worried About Running Out of Food in the Last Year: Never true    Ran Out of Food in the Last Year: Never true  Transportation Needs: No Transportation Needs (07/19/2023)   PRAPARE - Administrator, Civil Service (Medical): No    Lack of Transportation (Non-Medical): No  Physical Activity: Insufficiently Active (07/19/2023)   Exercise Vital Sign    Days of Exercise per Week: 3 days    Minutes of Exercise per Session: 30 min  Stress: No Stress Concern Present (07/19/2023)   [employer] of Occupational Health - Occupational Stress Questionnaire    Feeling of Stress : Only a little  Social Connections: Socially Integrated (07/19/2023)   Social Connection and Isolation Panel [NHANES]    Frequency of Communication with Friends and Family: More than three times a week    Frequency of Social Gatherings with Friends and Family: More than three times a week    Attends Religious Services: More than 4 times per year    Active Member of [employer] or Organizations: Yes    Attends Banker Meetings: More  than 4 times per year    Marital Status: Married  Intimate Partner Violence: Unknown (11/07/2021)   Received from [facility]   HITS    Physically Hurt: Not on file    Insult or Talk Down To: Not on file    Threaten Physical Harm: Not on file    Scream or Curse: Not on file    Past Surgical History:  Procedure Laterality Date   CESAREAN SECTION N/A 04/19/2020   Procedure: CESAREAN SECTION;  Surgeon: Tereso Newcomer, MD;  Location: [facility] LD ORS;  Service: Obstetrics;  Laterality: N/A;    Family History  Problem Relation Age of Onset   Diabetes Father     No Known  Allergies  Current Outpatient Medications on File Prior to Visit  Medication Sig Dispense Refill   fluticasone (FLONASE) 50 MCG/ACT nasal spray Place 2 sprays into both nostrils daily. 16 g 1   guaiFENesin (MUCINEX) 600 MG 12 hr tablet Take 2 tablets (1,200 mg total) by mouth 2 (two) times daily. 20 tablet 0   Multiple Vitamin (MULTIVITAMIN) capsule Take 1 capsule by mouth daily.     Omega-3 Fatty Acids (FISH OIL PO) Take by mouth.     omeprazole (PRILOSEC) 20 MG capsule Take 1 capsule (20 mg total) by mouth daily. 30 capsule 3   No current facility-administered medications on file prior to visit.    BP 120/68   Pulse 77   Resp 18   Ht 4\' 11"  (1.499 m)   Wt 124 lb (56.2 kg)   SpO2 100%   BMI 25.04 kg/m        Objective:   Physical Exam  General Mental Status- Alert. General Appearance- Not in acute distress.   Skin General: Color- Normal Color. Moisture- Normal Moisture.  Neck Carotid Arteries- Normal color. Moisture- Normal Moisture. No carotid bruits. No JVD.  Chest and Lung Exam Auscultation: Breath Sounds:-CTA  Cardiovascular Auscultation:Rythm- RRR Murmurs & Other Heart Sounds:Auscultation of the heart reveals- No Murmurs.  Abdomen Inspection:-Inspeection Normal. Palpation/Percussion:Note:No mass. Palpation and Percussion of the abdomen reveal- Non Tender, Non Distended + BS, no rebound or guarding.  Neurologic Cranial Nerve exam:- CN III-XII intact(No nystagmus), symmetric smile. Strength:- 5/5 equal and symmetric strength both upper and lower extremities.       Assessment & Plan:   Patient Instructions  For you wellness exam today I have ordered cbc, cmp, A1c, hiv  and  lipid panel..  Vaccine up to date.  Recommend exercise and healthy diet.  We will let you know lab results as they come in.  Follow up date appointment will be determined after lab review.       Esperanza Richters, PA-C

## 2023-09-21 NOTE — Addendum Note (Signed)
Addended by: Gwenevere Abbot on: 09/21/2023 08:26 AM   Modules accepted: Orders

## 2023-09-22 LAB — HIV ANTIBODY (ROUTINE TESTING W REFLEX): HIV 1&2 Ab, 4th Generation: NONREACTIVE

## 2023-09-23 NOTE — Addendum Note (Signed)
Addended by: Gwenevere Abbot on: 09/23/2023 08:51 AM   Modules accepted: Orders

## 2023-10-06 ENCOUNTER — Encounter: Payer: Self-pay | Admitting: Gastroenterology

## 2023-10-06 ENCOUNTER — Other Ambulatory Visit (HOSPITAL_BASED_OUTPATIENT_CLINIC_OR_DEPARTMENT_OTHER): Payer: Self-pay

## 2023-10-06 ENCOUNTER — Ambulatory Visit: Payer: 59 | Admitting: Gastroenterology

## 2023-10-06 ENCOUNTER — Other Ambulatory Visit: Payer: Self-pay

## 2023-10-06 VITALS — BP 110/60 | HR 76 | Ht 59.0 in | Wt 126.2 lb

## 2023-10-06 DIAGNOSIS — K219 Gastro-esophageal reflux disease without esophagitis: Secondary | ICD-10-CM | POA: Diagnosis not present

## 2023-10-06 MED ORDER — FAMOTIDINE 40 MG PO TABS
40.0000 mg | ORAL_TABLET | Freq: Two times a day (BID) | ORAL | 3 refills | Status: AC
  Filled 2023-10-06: qty 180, 90d supply, fill #0
  Filled 2023-10-06: qty 109, 55d supply, fill #0
  Filled 2023-11-24: qty 180, 90d supply, fill #1

## 2023-10-06 NOTE — Patient Instructions (Signed)
 Stop omeprazole.  We have sent the following medications to your pharmacy for you to pick up at your convenience: famotidine 40 mg twice daily.   _______________________________________________________  If your blood pressure at your visit was 140/90 or greater, please contact your primary care physician to follow up on this.  _______________________________________________________  If you are age 35 or older, your body mass index should be between 23-30. Your Body mass index is 25.5 kg/m. If this is out of the aforementioned range listed, please consider follow up with your Primary Care Provider.  If you are age 69 or younger, your body mass index should be between 19-25. Your Body mass index is 25.5 kg/m. If this is out of the aformentioned range listed, please consider follow up with your Primary Care Provider.   ________________________________________________________  The [facility] providers would like to encourage you to use [facility] to communicate with providers for non-urgent requests or questions.  Due to long hold times on the telephone, sending your provider a message by [facility] may be a faster and more efficient way to get a response.  Please allow 48 business hours for a response.  Please remember that this is for non-urgent requests.  _______________________________________________________

## 2023-10-06 NOTE — Progress Notes (Signed)
 Discussed the use of AI scribe software for clinical note transcription with the patient, who gave verbal consent to proceed  HPI : Crystal Joseph is a 33 y.o. female with gastroesophageal reflux disease (GERD) who presents with a flare-up of reflux symptoms.  She has been experiencing a flare-up of reflux symptoms for the past few months, which led her to resume taking omeprazole. The medication has been effective, and her symptoms have improved over time. She now takes omeprazole every other day or as needed. Reflux symptoms now occur infrequently, maybe once in the past month. She recalls using Pepcid (famotidine) once after experiencing nausea and a burning sensation following a meal, which partially alleviated her symptoms.  She is concerned about the long-term use of omeprazole, especially with plans to become pregnant within the year. She has made dietary changes, such as avoiding late heavy meals and opting for lighter meals in the evening, which she believes have contributed to the improvement of her reflux symptoms. Her symptoms are more bothersome at night when lying down but are manageable with her current regimen.      Past Medical History:  Diagnosis Date   Gestational diabetes    not diabetic. last a1c prediabetic range.   Uterine fibroid      Past Surgical History:  Procedure Laterality Date   CESAREAN SECTION N/A 04/19/2020   Procedure: CESAREAN SECTION;  Surgeon: Tereso Newcomer, MD;  Location: [facility] LD ORS;  Service: Obstetrics;  Laterality: N/A;   Family History  Problem Relation Age of Onset   Diabetes Father    Social History   Tobacco Use   Smoking status: Never   Smokeless tobacco: Never  Vaping Use   Vaping status: Never Used  Substance Use Topics   Alcohol use: Never   Drug use: Never   Current Outpatient Medications  Medication Sig Dispense Refill   fluticasone (FLONASE) 50 MCG/ACT nasal spray Place 2 sprays into both nostrils daily. 16 g 1   Multiple  Vitamin (MULTIVITAMIN) capsule Take 1 capsule by mouth daily.     Omega-3 Fatty Acids (FISH OIL PO) Take by mouth.     omeprazole (PRILOSEC) 20 MG capsule Take 1 capsule (20 mg total) by mouth daily. (Patient taking differently: Take 20 mg by mouth as needed.) 30 capsule 3   No current facility-administered medications for this visit.   No Known Allergies   Review of Systems: All systems reviewed and negative except where noted in HPI.    No results found.  Physical Exam: BP 110/60 (BP Location: Left Arm, Patient Position: Sitting, Cuff Size: Normal)   Pulse 76   Ht 4\' 11"  (1.499 m)   Wt 126 lb 4 oz (57.3 kg)   LMP 09/16/2023   BMI 25.50 kg/m  Constitutional: Pleasant,well-developed, Asian female in no acute distress. HEENT: Normocephalic and atraumatic. Conjunctivae are normal. No scleral icterus. Neurological: Alert and oriented to person place and time. Skin: Skin is warm and dry. No rashes noted. Psychiatric: Normal mood and affect. Behavior is normal.  CBC    Component Value Date/Time   WBC 7.6 09/21/2023 0829   RBC 4.23 09/21/2023 0829   HGB 12.5 09/21/2023 0829   HGB 11.5 02/10/2020 0909   HCT 37.6 09/21/2023 0829   HCT 34.6 02/10/2020 0909   PLT 330.0 09/21/2023 0829   PLT 259 02/10/2020 0909   MCV 88.9 09/21/2023 0829   MCV 92 02/10/2020 0909   MCH 29.5 04/20/2020 0503   MCHC 33.2 09/21/2023 0829  RDW 14.3 09/21/2023 0829   RDW 13.7 02/10/2020 0909   LYMPHSABS 1.9 09/21/2023 0829   LYMPHSABS 1.6 09/29/2019 1028   MONOABS 0.4 09/21/2023 0829   EOSABS 0.1 09/21/2023 0829   EOSABS 0.3 09/29/2019 1028   BASOSABS 0.0 09/21/2023 0829   BASOSABS 0.0 09/29/2019 1028    CMP     Component Value Date/Time   NA 140 09/21/2023 0829   K 4.5 09/21/2023 0829   CL 106 09/21/2023 0829   CO2 25 09/21/2023 0829   GLUCOSE 110 (H) 09/21/2023 0829   BUN 14 09/21/2023 0829   CREATININE 0.57 09/21/2023 0829   CALCIUM 9.0 09/21/2023 0829   PROT 7.5 09/21/2023 0829    ALBUMIN 4.4 09/21/2023 0829   AST 14 09/21/2023 0829   ALT 13 09/21/2023 0829   ALKPHOS 55 09/21/2023 0829   BILITOT 0.2 09/21/2023 0829   GFRNONAA >60 04/20/2020 0503   GFRAA >60 04/20/2020 0503       Latest Ref Rng & Units 09/21/2023    8:29 AM 09/17/2022    8:21 AM 09/16/2021    8:41 AM  CBC EXTENDED  WBC 4.0 - 10.5 K/uL 7.6  6.8  5.7   RBC 3.87 - 5.11 Mil/uL 4.23  4.29  4.47   Hemoglobin 12.0 - 15.0 g/dL 13.0  86.5  78.4   HCT 36.0 - 46.0 % 37.6  38.1  39.8   Platelets 150.0 - 400.0 K/uL 330.0  371.0  302.0   NEUT# 1.4 - 7.7 K/uL 5.2  4.4  3.4   Lymph# 0.7 - 4.0 K/uL 1.9  1.9  1.9       ASSESSMENT AND PLAN:  33 year old female with longstanding typical reflux symptoms, well-controlled with omeprazole, currently on every other day omeprazole for the past month with good symptom control.  She is planning to get pregnant soon and is concerned about safety of omeprazole during pregnancy.  She had previously taken famotidine with partial improvement in symptoms.  As she has made some dietary improvements, I think another trial of famotidine, possibly with increasing dose/frequency is very reasonable. I recommended she start taking famotidine daily, and if her symptoms are well-controlled, then to move to as needed dosing.  She was advised that she can increase dosing to twice daily if needed.  If her symptoms are not well-controlled with twice daily Pepcid plus dietary modifications, we may need to consider an alternative PPI while she is trying to get pregnant  GERD -Start famotidine 40 mg daily, can switch to as needed or twice daily dosing based on symptom frequency -Dietary/behavioral modifications for GERD reinforced -May consider lansoprazole while trying to get pregnant/during pregnancy if symptoms not well-controlled with optimized Pepcid dosing/dietary modifications  Dyesha Henault E. Tomasa Rand, MD [facility]     [place], [address], [place]

## 2023-11-09 ENCOUNTER — Encounter: Payer: Self-pay | Admitting: Medical

## 2023-11-24 ENCOUNTER — Other Ambulatory Visit: Payer: Self-pay

## 2023-12-04 ENCOUNTER — Other Ambulatory Visit (HOSPITAL_BASED_OUTPATIENT_CLINIC_OR_DEPARTMENT_OTHER): Payer: Self-pay

## 2023-12-23 ENCOUNTER — Other Ambulatory Visit (INDEPENDENT_AMBULATORY_CARE_PROVIDER_SITE_OTHER)

## 2023-12-23 ENCOUNTER — Ambulatory Visit: Payer: Self-pay | Admitting: Medical

## 2023-12-23 DIAGNOSIS — R739 Hyperglycemia, unspecified: Secondary | ICD-10-CM | POA: Diagnosis not present

## 2023-12-23 LAB — HEMOGLOBIN A1C: Hgb A1c MFr Bld: 6 % (ref 4.6–6.5)

## 2024-04-11 ENCOUNTER — Other Ambulatory Visit: Payer: Self-pay

## 2024-04-11 ENCOUNTER — Encounter: Payer: Self-pay | Admitting: Medical

## 2024-04-11 DIAGNOSIS — R739 Hyperglycemia, unspecified: Secondary | ICD-10-CM

## 2024-05-05 ENCOUNTER — Other Ambulatory Visit (HOSPITAL_BASED_OUTPATIENT_CLINIC_OR_DEPARTMENT_OTHER): Payer: Self-pay

## 2024-05-05 ENCOUNTER — Other Ambulatory Visit (INDEPENDENT_AMBULATORY_CARE_PROVIDER_SITE_OTHER)

## 2024-05-05 DIAGNOSIS — R739 Hyperglycemia, unspecified: Secondary | ICD-10-CM | POA: Diagnosis not present

## 2024-05-05 LAB — HEMOGLOBIN A1C: Hgb A1c MFr Bld: 6 % (ref 4.6–6.5)

## 2024-05-05 MED ORDER — FLUZONE 0.5 ML IM SUSY
0.5000 mL | PREFILLED_SYRINGE | Freq: Once | INTRAMUSCULAR | 0 refills | Status: AC
  Filled 2024-05-05: qty 0.5, 1d supply, fill #0

## 2024-05-06 ENCOUNTER — Ambulatory Visit: Payer: Self-pay | Admitting: Medical

## 2024-05-17 ENCOUNTER — Other Ambulatory Visit (HOSPITAL_COMMUNITY): Payer: Self-pay

## 2024-06-15 ENCOUNTER — Ambulatory Visit: Admitting: Medical

## 2024-09-21 ENCOUNTER — Encounter: Admitting: Medical

## 2024-10-03 ENCOUNTER — Encounter: Admitting: Medical
